# Patient Record
Sex: Female | Born: 1975 | Race: White | Hispanic: No | Smoking: Current every day smoker
Health system: Southern US, Community
[De-identification: ages and names within clinical notes are randomized; demographics above are authoritative.]

## PROBLEM LIST (undated history)

## (undated) DIAGNOSIS — R519 Headache, unspecified: Secondary | ICD-10-CM

## (undated) DIAGNOSIS — R51 Headache: Secondary | ICD-10-CM

## (undated) DIAGNOSIS — K5790 Diverticulosis of intestine, part unspecified, without perforation or abscess without bleeding: Secondary | ICD-10-CM

## (undated) DIAGNOSIS — E7800 Pure hypercholesterolemia, unspecified: Secondary | ICD-10-CM

## (undated) DIAGNOSIS — J45909 Unspecified asthma, uncomplicated: Secondary | ICD-10-CM

## (undated) DIAGNOSIS — F988 Other specified behavioral and emotional disorders with onset usually occurring in childhood and adolescence: Secondary | ICD-10-CM

## (undated) DIAGNOSIS — K589 Irritable bowel syndrome without diarrhea: Secondary | ICD-10-CM

## (undated) DIAGNOSIS — T4145XA Adverse effect of unspecified anesthetic, initial encounter: Secondary | ICD-10-CM

## (undated) DIAGNOSIS — S83511A Sprain of anterior cruciate ligament of right knee, initial encounter: Secondary | ICD-10-CM

## (undated) DIAGNOSIS — K219 Gastro-esophageal reflux disease without esophagitis: Secondary | ICD-10-CM

## (undated) DIAGNOSIS — T8859XA Other complications of anesthesia, initial encounter: Secondary | ICD-10-CM

## (undated) DIAGNOSIS — E162 Hypoglycemia, unspecified: Secondary | ICD-10-CM

## (undated) DIAGNOSIS — E039 Hypothyroidism, unspecified: Secondary | ICD-10-CM

## (undated) DIAGNOSIS — M5136 Other intervertebral disc degeneration, lumbar region: Secondary | ICD-10-CM

## (undated) DIAGNOSIS — M51369 Other intervertebral disc degeneration, lumbar region without mention of lumbar back pain or lower extremity pain: Secondary | ICD-10-CM

## (undated) DIAGNOSIS — M7750 Other enthesopathy of unspecified foot: Secondary | ICD-10-CM

## (undated) DIAGNOSIS — Z969 Presence of functional implant, unspecified: Secondary | ICD-10-CM

## (undated) HISTORY — PX: APPENDECTOMY: SHX54

## (undated) HISTORY — PX: TUBAL LIGATION: SHX77

## (undated) HISTORY — PX: LEEP: SHX91

---

## 2001-08-15 ENCOUNTER — Emergency Department (HOSPITAL_COMMUNITY): Admission: EM | Admit: 2001-08-15 | Discharge: 2001-08-15 | Payer: Self-pay | Admitting: Emergency Medicine

## 2001-10-25 ENCOUNTER — Ambulatory Visit (HOSPITAL_COMMUNITY): Admission: RE | Admit: 2001-10-25 | Discharge: 2001-10-25 | Payer: Self-pay | Admitting: Orthopedic Surgery

## 2001-10-25 ENCOUNTER — Encounter: Payer: Self-pay | Admitting: Orthopedic Surgery

## 2001-12-14 ENCOUNTER — Emergency Department (HOSPITAL_COMMUNITY): Admission: EM | Admit: 2001-12-14 | Discharge: 2001-12-14 | Payer: Self-pay | Admitting: Emergency Medicine

## 2002-05-30 ENCOUNTER — Emergency Department (HOSPITAL_COMMUNITY): Admission: EM | Admit: 2002-05-30 | Discharge: 2002-05-30 | Payer: Self-pay | Admitting: Emergency Medicine

## 2003-07-10 ENCOUNTER — Emergency Department (HOSPITAL_COMMUNITY): Admission: AD | Admit: 2003-07-10 | Discharge: 2003-07-10 | Payer: Self-pay | Admitting: Family Medicine

## 2003-07-18 ENCOUNTER — Emergency Department (HOSPITAL_COMMUNITY): Admission: EM | Admit: 2003-07-18 | Discharge: 2003-07-18 | Payer: Self-pay | Admitting: Emergency Medicine

## 2003-10-29 ENCOUNTER — Emergency Department (HOSPITAL_COMMUNITY): Admission: EM | Admit: 2003-10-29 | Discharge: 2003-10-29 | Payer: Self-pay | Admitting: *Deleted

## 2003-11-19 ENCOUNTER — Emergency Department (HOSPITAL_COMMUNITY): Admission: EM | Admit: 2003-11-19 | Discharge: 2003-11-19 | Payer: Self-pay | Admitting: Emergency Medicine

## 2003-12-11 ENCOUNTER — Emergency Department (HOSPITAL_COMMUNITY): Admission: EM | Admit: 2003-12-11 | Discharge: 2003-12-11 | Payer: Self-pay | Admitting: *Deleted

## 2004-01-25 ENCOUNTER — Emergency Department (HOSPITAL_COMMUNITY): Admission: EM | Admit: 2004-01-25 | Discharge: 2004-01-25 | Payer: Self-pay | Admitting: Emergency Medicine

## 2004-01-26 ENCOUNTER — Other Ambulatory Visit: Admission: RE | Admit: 2004-01-26 | Discharge: 2004-01-26 | Payer: Self-pay | Admitting: Family Medicine

## 2004-01-26 ENCOUNTER — Ambulatory Visit: Payer: Self-pay | Admitting: Sports Medicine

## 2004-06-28 ENCOUNTER — Emergency Department (HOSPITAL_COMMUNITY): Admission: EM | Admit: 2004-06-28 | Discharge: 2004-06-28 | Payer: Self-pay | Admitting: Emergency Medicine

## 2004-07-01 ENCOUNTER — Ambulatory Visit: Payer: Self-pay | Admitting: Family Medicine

## 2004-07-29 ENCOUNTER — Ambulatory Visit: Payer: Self-pay | Admitting: Family Medicine

## 2004-08-25 ENCOUNTER — Emergency Department (HOSPITAL_COMMUNITY): Admission: EM | Admit: 2004-08-25 | Discharge: 2004-08-25 | Payer: Self-pay | Admitting: *Deleted

## 2004-09-16 ENCOUNTER — Emergency Department (HOSPITAL_COMMUNITY): Admission: EM | Admit: 2004-09-16 | Discharge: 2004-09-16 | Payer: Self-pay | Admitting: Emergency Medicine

## 2004-10-06 ENCOUNTER — Emergency Department (HOSPITAL_COMMUNITY): Admission: EM | Admit: 2004-10-06 | Discharge: 2004-10-06 | Payer: Self-pay | Admitting: Emergency Medicine

## 2004-10-20 ENCOUNTER — Emergency Department (HOSPITAL_COMMUNITY): Admission: EM | Admit: 2004-10-20 | Discharge: 2004-10-20 | Payer: Self-pay | Admitting: *Deleted

## 2004-10-23 ENCOUNTER — Emergency Department (HOSPITAL_COMMUNITY): Admission: EM | Admit: 2004-10-23 | Discharge: 2004-10-23 | Payer: Self-pay | Admitting: Emergency Medicine

## 2004-10-24 ENCOUNTER — Emergency Department (HOSPITAL_COMMUNITY): Admission: EM | Admit: 2004-10-24 | Discharge: 2004-10-24 | Payer: Self-pay | Admitting: Emergency Medicine

## 2004-12-28 ENCOUNTER — Emergency Department (HOSPITAL_COMMUNITY): Admission: EM | Admit: 2004-12-28 | Discharge: 2004-12-28 | Payer: Self-pay | Admitting: Emergency Medicine

## 2005-01-05 ENCOUNTER — Encounter (INDEPENDENT_AMBULATORY_CARE_PROVIDER_SITE_OTHER): Payer: Self-pay | Admitting: *Deleted

## 2005-01-05 LAB — CONVERTED CEMR LAB

## 2005-01-06 ENCOUNTER — Emergency Department (HOSPITAL_COMMUNITY): Admission: EM | Admit: 2005-01-06 | Discharge: 2005-01-06 | Payer: Self-pay | Admitting: Emergency Medicine

## 2005-01-29 ENCOUNTER — Ambulatory Visit: Payer: Self-pay | Admitting: Family Medicine

## 2005-02-25 ENCOUNTER — Encounter: Admission: RE | Admit: 2005-02-25 | Discharge: 2005-02-25 | Payer: Self-pay | Admitting: Family Medicine

## 2005-04-25 ENCOUNTER — Emergency Department (HOSPITAL_COMMUNITY): Admission: EM | Admit: 2005-04-25 | Discharge: 2005-04-25 | Payer: Self-pay | Admitting: Emergency Medicine

## 2006-06-04 DIAGNOSIS — E669 Obesity, unspecified: Secondary | ICD-10-CM

## 2006-06-04 DIAGNOSIS — F411 Generalized anxiety disorder: Secondary | ICD-10-CM | POA: Insufficient documentation

## 2006-06-04 DIAGNOSIS — K429 Umbilical hernia without obstruction or gangrene: Secondary | ICD-10-CM | POA: Insufficient documentation

## 2006-06-04 DIAGNOSIS — N83209 Unspecified ovarian cyst, unspecified side: Secondary | ICD-10-CM

## 2006-06-04 DIAGNOSIS — F17200 Nicotine dependence, unspecified, uncomplicated: Secondary | ICD-10-CM

## 2006-06-04 DIAGNOSIS — J45909 Unspecified asthma, uncomplicated: Secondary | ICD-10-CM | POA: Insufficient documentation

## 2006-06-05 ENCOUNTER — Encounter (INDEPENDENT_AMBULATORY_CARE_PROVIDER_SITE_OTHER): Payer: Self-pay | Admitting: *Deleted

## 2006-06-09 ENCOUNTER — Emergency Department (HOSPITAL_COMMUNITY): Admission: EM | Admit: 2006-06-09 | Discharge: 2006-06-09 | Payer: Self-pay | Admitting: Family Medicine

## 2006-08-03 ENCOUNTER — Emergency Department (HOSPITAL_COMMUNITY): Admission: EM | Admit: 2006-08-03 | Discharge: 2006-08-03 | Payer: Self-pay | Admitting: Family Medicine

## 2006-08-04 ENCOUNTER — Ambulatory Visit: Payer: Self-pay | Admitting: Family Medicine

## 2006-08-04 ENCOUNTER — Encounter (INDEPENDENT_AMBULATORY_CARE_PROVIDER_SITE_OTHER): Payer: Self-pay | Admitting: Family Medicine

## 2006-08-12 ENCOUNTER — Encounter (INDEPENDENT_AMBULATORY_CARE_PROVIDER_SITE_OTHER): Payer: Self-pay | Admitting: Family Medicine

## 2006-08-20 ENCOUNTER — Telehealth: Payer: Self-pay | Admitting: *Deleted

## 2006-08-24 ENCOUNTER — Encounter: Payer: Self-pay | Admitting: *Deleted

## 2006-09-01 ENCOUNTER — Encounter: Payer: Self-pay | Admitting: *Deleted

## 2006-12-08 ENCOUNTER — Telehealth (INDEPENDENT_AMBULATORY_CARE_PROVIDER_SITE_OTHER): Payer: Self-pay | Admitting: Family Medicine

## 2007-01-05 ENCOUNTER — Ambulatory Visit: Payer: Self-pay | Admitting: Family Medicine

## 2007-01-05 ENCOUNTER — Encounter (INDEPENDENT_AMBULATORY_CARE_PROVIDER_SITE_OTHER): Payer: Self-pay | Admitting: Family Medicine

## 2007-01-05 DIAGNOSIS — E039 Hypothyroidism, unspecified: Secondary | ICD-10-CM | POA: Insufficient documentation

## 2007-01-05 DIAGNOSIS — E782 Mixed hyperlipidemia: Secondary | ICD-10-CM

## 2007-01-05 LAB — CONVERTED CEMR LAB
Albumin: 4.5 g/dL (ref 3.5–5.2)
Alkaline Phosphatase: 51 units/L (ref 39–117)
BUN: 11 mg/dL (ref 6–23)
Calcium: 9.1 mg/dL (ref 8.4–10.5)
Chloride: 109 meq/L (ref 96–112)
Direct LDL: 128 mg/dL — ABNORMAL HIGH
Glucose, Bld: 91 mg/dL (ref 70–99)
Hemoglobin: 15.8 g/dL — ABNORMAL HIGH (ref 12.0–15.0)
MCHC: 34.2 g/dL (ref 30.0–36.0)
Potassium: 4.7 meq/L (ref 3.5–5.3)
RBC: 5.34 M/uL — ABNORMAL HIGH (ref 3.87–5.11)
Sodium: 140 meq/L (ref 135–145)
Total Protein: 7 g/dL (ref 6.0–8.3)
WBC: 8.6 10*3/uL (ref 4.0–10.5)

## 2007-01-06 ENCOUNTER — Encounter (INDEPENDENT_AMBULATORY_CARE_PROVIDER_SITE_OTHER): Payer: Self-pay | Admitting: Family Medicine

## 2007-01-12 ENCOUNTER — Telehealth (INDEPENDENT_AMBULATORY_CARE_PROVIDER_SITE_OTHER): Payer: Self-pay | Admitting: Family Medicine

## 2007-01-15 ENCOUNTER — Telehealth (INDEPENDENT_AMBULATORY_CARE_PROVIDER_SITE_OTHER): Payer: Self-pay | Admitting: *Deleted

## 2007-01-26 ENCOUNTER — Encounter (INDEPENDENT_AMBULATORY_CARE_PROVIDER_SITE_OTHER): Payer: Self-pay | Admitting: Family Medicine

## 2007-02-08 ENCOUNTER — Encounter: Payer: Self-pay | Admitting: *Deleted

## 2007-03-19 ENCOUNTER — Other Ambulatory Visit: Admission: RE | Admit: 2007-03-19 | Discharge: 2007-03-19 | Payer: Self-pay | Admitting: Family Medicine

## 2007-03-19 ENCOUNTER — Ambulatory Visit: Payer: Self-pay | Admitting: Family Medicine

## 2007-03-19 ENCOUNTER — Encounter (INDEPENDENT_AMBULATORY_CARE_PROVIDER_SITE_OTHER): Payer: Self-pay | Admitting: Family Medicine

## 2007-03-19 LAB — CONVERTED CEMR LAB

## 2007-03-25 LAB — CONVERTED CEMR LAB: Pap Smear: NORMAL

## 2007-04-16 ENCOUNTER — Telehealth (INDEPENDENT_AMBULATORY_CARE_PROVIDER_SITE_OTHER): Payer: Self-pay | Admitting: Family Medicine

## 2007-04-19 ENCOUNTER — Telehealth (INDEPENDENT_AMBULATORY_CARE_PROVIDER_SITE_OTHER): Payer: Self-pay | Admitting: Family Medicine

## 2007-05-14 ENCOUNTER — Ambulatory Visit: Payer: Self-pay | Admitting: Family Medicine

## 2007-06-16 ENCOUNTER — Encounter (INDEPENDENT_AMBULATORY_CARE_PROVIDER_SITE_OTHER): Payer: Self-pay | Admitting: Family Medicine

## 2007-06-16 ENCOUNTER — Ambulatory Visit: Payer: Self-pay | Admitting: Family Medicine

## 2007-07-01 ENCOUNTER — Telehealth (INDEPENDENT_AMBULATORY_CARE_PROVIDER_SITE_OTHER): Payer: Self-pay | Admitting: Family Medicine

## 2007-07-07 ENCOUNTER — Encounter: Payer: Self-pay | Admitting: *Deleted

## 2007-07-12 ENCOUNTER — Telehealth (INDEPENDENT_AMBULATORY_CARE_PROVIDER_SITE_OTHER): Payer: Self-pay | Admitting: Family Medicine

## 2007-07-20 ENCOUNTER — Ambulatory Visit: Payer: Self-pay | Admitting: Family Medicine

## 2007-09-21 ENCOUNTER — Encounter (INDEPENDENT_AMBULATORY_CARE_PROVIDER_SITE_OTHER): Payer: Self-pay | Admitting: Family Medicine

## 2007-09-22 ENCOUNTER — Encounter: Payer: Self-pay | Admitting: Family Medicine

## 2007-10-19 ENCOUNTER — Encounter: Payer: Self-pay | Admitting: Family Medicine

## 2007-12-06 ENCOUNTER — Telehealth: Payer: Self-pay | Admitting: *Deleted

## 2007-12-08 ENCOUNTER — Ambulatory Visit: Payer: Self-pay | Admitting: Family Medicine

## 2007-12-08 DIAGNOSIS — J309 Allergic rhinitis, unspecified: Secondary | ICD-10-CM | POA: Insufficient documentation

## 2007-12-08 DIAGNOSIS — G894 Chronic pain syndrome: Secondary | ICD-10-CM | POA: Insufficient documentation

## 2007-12-18 ENCOUNTER — Emergency Department (HOSPITAL_COMMUNITY): Admission: EM | Admit: 2007-12-18 | Discharge: 2007-12-19 | Payer: Self-pay | Admitting: Emergency Medicine

## 2007-12-21 ENCOUNTER — Encounter (INDEPENDENT_AMBULATORY_CARE_PROVIDER_SITE_OTHER): Payer: Self-pay | Admitting: *Deleted

## 2007-12-28 ENCOUNTER — Encounter (INDEPENDENT_AMBULATORY_CARE_PROVIDER_SITE_OTHER): Payer: Self-pay | Admitting: *Deleted

## 2007-12-28 ENCOUNTER — Encounter: Payer: Self-pay | Admitting: *Deleted

## 2007-12-29 ENCOUNTER — Encounter: Payer: Self-pay | Admitting: Family Medicine

## 2008-01-01 ENCOUNTER — Emergency Department (HOSPITAL_COMMUNITY): Admission: EM | Admit: 2008-01-01 | Discharge: 2008-01-02 | Payer: Self-pay | Admitting: Emergency Medicine

## 2008-01-03 ENCOUNTER — Telehealth: Payer: Self-pay | Admitting: *Deleted

## 2008-01-04 ENCOUNTER — Emergency Department (HOSPITAL_COMMUNITY): Admission: EM | Admit: 2008-01-04 | Discharge: 2008-01-05 | Payer: Self-pay | Admitting: Family Medicine

## 2008-01-05 ENCOUNTER — Ambulatory Visit: Payer: Self-pay | Admitting: Gynecology

## 2008-01-06 ENCOUNTER — Encounter: Payer: Self-pay | Admitting: *Deleted

## 2008-01-06 ENCOUNTER — Ambulatory Visit: Payer: Self-pay | Admitting: Family Medicine

## 2008-01-07 ENCOUNTER — Ambulatory Visit: Payer: Self-pay | Admitting: Gynecology

## 2008-01-07 ENCOUNTER — Encounter: Payer: Self-pay | Admitting: Gynecology

## 2008-01-07 ENCOUNTER — Ambulatory Visit (HOSPITAL_BASED_OUTPATIENT_CLINIC_OR_DEPARTMENT_OTHER): Admission: RE | Admit: 2008-01-07 | Discharge: 2008-01-07 | Payer: Self-pay | Admitting: Gynecology

## 2008-01-07 HISTORY — PX: LAPAROSCOPIC OVARIAN CYSTECTOMY: SHX6248

## 2008-01-07 HISTORY — PX: LAPAROSCOPIC UNILATERAL SALPINGECTOMY: SHX5934

## 2008-01-11 ENCOUNTER — Encounter: Payer: Self-pay | Admitting: Family Medicine

## 2008-01-11 ENCOUNTER — Encounter (INDEPENDENT_AMBULATORY_CARE_PROVIDER_SITE_OTHER): Payer: Self-pay | Admitting: *Deleted

## 2008-01-19 ENCOUNTER — Ambulatory Visit: Payer: Self-pay | Admitting: Gynecology

## 2008-01-24 ENCOUNTER — Emergency Department (HOSPITAL_COMMUNITY): Admission: EM | Admit: 2008-01-24 | Discharge: 2008-01-24 | Payer: Self-pay | Admitting: Emergency Medicine

## 2008-01-24 ENCOUNTER — Telehealth: Payer: Self-pay | Admitting: Family Medicine

## 2008-02-08 ENCOUNTER — Encounter (INDEPENDENT_AMBULATORY_CARE_PROVIDER_SITE_OTHER): Payer: Self-pay | Admitting: *Deleted

## 2008-02-17 ENCOUNTER — Telehealth: Payer: Self-pay | Admitting: Family Medicine

## 2008-02-29 ENCOUNTER — Ambulatory Visit: Payer: Self-pay | Admitting: Family Medicine

## 2008-02-29 ENCOUNTER — Encounter: Payer: Self-pay | Admitting: Family Medicine

## 2008-02-29 DIAGNOSIS — M2240 Chondromalacia patellae, unspecified knee: Secondary | ICD-10-CM

## 2008-02-29 LAB — CONVERTED CEMR LAB: TSH: 5.844 microintl units/mL — ABNORMAL HIGH (ref 0.350–4.50)

## 2008-03-06 ENCOUNTER — Telehealth: Payer: Self-pay | Admitting: *Deleted

## 2008-04-03 ENCOUNTER — Emergency Department (HOSPITAL_COMMUNITY): Admission: EM | Admit: 2008-04-03 | Discharge: 2008-04-03 | Payer: Self-pay | Admitting: Family Medicine

## 2008-04-24 ENCOUNTER — Telehealth: Payer: Self-pay | Admitting: *Deleted

## 2008-04-25 ENCOUNTER — Ambulatory Visit: Payer: Self-pay | Admitting: Family Medicine

## 2008-04-25 ENCOUNTER — Emergency Department (HOSPITAL_COMMUNITY): Admission: EM | Admit: 2008-04-25 | Discharge: 2008-04-25 | Payer: Self-pay | Admitting: Emergency Medicine

## 2008-05-17 ENCOUNTER — Telehealth: Payer: Self-pay | Admitting: Family Medicine

## 2008-05-18 ENCOUNTER — Encounter: Payer: Self-pay | Admitting: Family Medicine

## 2010-01-20 ENCOUNTER — Encounter: Payer: Self-pay | Admitting: Family Medicine

## 2010-03-15 ENCOUNTER — Telehealth: Payer: Self-pay | Admitting: Family Medicine

## 2010-05-07 NOTE — Letter (Signed)
Summary: [place] [facility]:  Psychological Assessment  [facility]:  Psychological Assessment   Imported By: Haydee Salter 06/02/2008 14:59:30  _____________________________________________________________________  External Attachment:    Type:   Image     Comment:   External Document

## 2010-05-07 NOTE — Progress Notes (Signed)
Summary: Refill  Phone Note Refill Request Call back at Home Phone [phone] Message from:  Patient  Refills Requested: Medication #1:  VICODIN 5-500 MG  TABS 1 tab by mouth two times a day as needed pain. wants to discuss with rn about being in a lot of pain  Initial call taken by: Haydee Salter,  January 24, 2008 9:40 AM  Follow-up for Phone Call        Phone call to pt- number busy x2. Follow-up by: AMY MARTIN RN,  January 24, 2008 11:16 AM  Additional Follow-up for Phone Call Additional follow up Details #1::        Patient must make appt if requesting narcotic. Please see last visit note for details. Thanks.  Additional Follow-up by: Helane Rima MD,  January 24, 2008 12:09 PM    Additional Follow-up for Phone Call Additional follow up Details #2::    Number busy. Follow-up by: AMY MARTIN RN,  January 24, 2008 12:30 PM  Additional Follow-up for Phone Call Additional follow up Details #3:: Details for Additional Follow-up Action Taken: returning call Additional Follow-up by: Haydee Salter,  January 24, 2008 2:10 PM  pt calling again.....WHITNEY KIVETT 01/24/08 2:42PM  attempted to call patient .no answer. Theresia Lo RN  January 24, 2008 5:18 PM  attempted to call again. no answer. Theresia Lo RN  January 24, 2008 6:12 PM  called pt no answer.Jacki Cones RN  January 25, 2008 12:32 PM  Appended Document: Refill is calling again, states she can be reached at the above number  Appended Document: Refill called again no answer.  Will await callback once again.  NO REFILLS without appt.

## 2010-05-07 NOTE — Miscellaneous (Signed)
Summary: Records from [facility]  Clinical Lists Changes I have reviewed Rachel Coleman's records from the [facility]. She was incarcerated 09/15/07-11/04/07. She was NOT given a narcotic or benzo during that time. I will have pertinent pages scanned for documentation. Please note that she and her husband have a history of polysubstance abuse.

## 2010-05-07 NOTE — Miscellaneous (Signed)
Summary: Asthma Update - Intermittent     New Problems: ASTHMA, INTERMITTENT (ICD-493.90)   New Problems: ASTHMA, INTERMITTENT (ICD-493.90) 

## 2010-05-07 NOTE — Miscellaneous (Signed)
Summary: N[facility]IW  Records from previous office are in your box.  Please mark the pages you would like scanned, then return to front office Admin Team.

## 2010-05-07 NOTE — Progress Notes (Signed)
Summary: Refill  Phone Note Call from Patient Call back at [phone]    Reason for Call: Refill Medication Summary of Call: pt is requesting a refill on her meds, tried scheduling an appt but MD is booked until Dec.  pt will call next wk for an appt Initial call taken by: Knox Royalty,  February 17, 2008 9:36 AM  Follow-up for Phone Call        called pt. pt needs refill of klonopin and vicodin. i told the pt that i need to ask dr.Psalms Olarte for refills. dnka x 2. pt said, that 01-11-08 she had surgery and 02-08-08 her husband had appt, had surgery also. she said, that she would sched. ov with dr.Victorious Kundinger asap. fwd. to dr.Kiel Cockerell for review. pt said, that she wants written rx's. also said, that she is applying for disability at this point and that dr.Taesha Goodell will receive a form in the mail. Follow-up by: Arlyss Repress CMA,,  February 17, 2008 9:49 AM  Additional Follow-up for Phone Call Additional follow up Details #1::        Patient MUST be seen by a physician for refills of both medications, especially the Vicodin. Please see my last office note for details. Unless her exam/ history has changed, I will not prescribe narcotics. Also, please inform her that I do not fill out disablity paperwork (one is usually provided for that). Additional Follow-up by: Helane Rima MD,  February 17, 2008 12:49 PM      Appended Document: Refill called pt. no answer.  Appended Document: Refill pt would like to know if there is an openeing before december. She was told there were noopenings before this. Do you want to double book an appt/? or have her wait?  Appended Document: Refill You may double book. Thanks! Luticia Tadros  Appended Document: Refill wants to know if we can doublebook MD for rx refills as she states she had to go to court last week and had an anxiety attack there.

## 2010-05-07 NOTE — Progress Notes (Signed)
Summary: refill request  Phone Note From Pharmacy   Summary of Call: received call from [facility] . states patient brought in bottle for refill for Levothyroxin 100 mcg.. she had previously gotten from [facility]. requests refill request be sent to [facility]. this dose is not noted in chart. will send message to MD to please advise. Initial call taken by: Theresia Lo RN,  May 17, 2008 10:04 AM    I recently increased her dose to 112 micrograms daily. Dose in chart is correct. Please inform patient that Rx sent to [facility].  Prescriptions: SYNTHROID 112 MCG TABS (LEVOTHYROXINE SODIUM) one by mouth daily  #30 x 3   Entered and Authorized by:   Helane Rima MD   Signed by:   Helane Rima MD on 05/17/2008   Method used:   Electronically to        [facility] (retail)       [address]       [place], [place]  [id number]       Ph: [phone]       Fax: [phone]   RxID:   [id number]   Appended Document: refill request unable to contact patient by phone. called pharmacist and he will point out to patient that this is a change in dose when she picks up.

## 2010-05-07 NOTE — Assessment & Plan Note (Signed)
Summary: meds wp per dr Raymone Pembroke   Vital Signs:  Patient Profile:   35 Years Old Female Height:     65 inches (165.10 cm) Weight:      205.0 pounds Temp:     98.3 degrees F oral Pulse rate:   80 / minute BP sitting:   101 / 60  (left arm)  Vitals Entered By: Alphia Kava (February 29, 2008 2:12 PM)                 PCP:  Helane Rima DO  Chief Complaint:  f/u meds.  History of Present Illness: Rachel Coleman is a 35 year old female presenting with requests for Vicodin and Klonopin refills. Please see previous note for my concerns regarding these medications in treating this patient's symptoms.  1. VICODIN: The patient is requesting Vicodin for her chronic knee and ankle pain. A review of E-Chart reveals a recent ED visit for right foot pain. The ED physician noted that Rachel Coleman c/o right foot pain, previously diagnosed as a bone spur by Dr. Chaney Malling, that will eventually require surgery to alleviate pain. She stated that she has an appointment with him in one week, but needed pain medication until that visit. She was prescribed Percocet 5/325 #10. I contacted Dr. Andreas Blower office and was informed that she has not been a patient since 2008.   Her records were faxed to our office and reviewed. 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 Dr. Chaney Malling did consider surgery a treatment for this pain, writing "No guarantee can be given as to outcome, but clearly this is abnormal anatomy and probably causing her discomfort." He did not prescribe or recommend narcotics.  2. KLONOPIN: I have reviewed the patient's chart and noted Dr. Frutoso Chase intention to wean the patient off of this medication. I agree with  this plan. She was incarcerated from 09/15/07 to 10/25/07, during which time she was not on Klonopin.   Note: The patient also states that she has been told that she may have ADHD by another physician and was told to go to the [facility], but that they are too expensive.  3. HYPOTHYROIDISM: Due for TSH check. Has been on Synthroid 100 micrograms daily.  4. 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 [facility] to [facility]copy Clinic 01/11/08.    Current Allergies: ! 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    Review of Systems       The patient complains of difficulty walking.  The patient denies fever, chest pain, syncope, dyspnea on exertion, peripheral edema, prolonged cough, and abdominal pain.    MS  Complains of joint pain.  Psych      Complains of anxiety and panic attacks.   Physical Exam  General:     Alert, well-developed, well-nourished, well-hydrated, and overweight-appearing.   Neck:     No deformities, masses, or tenderness noted. Lungs:     Normal respiratory effort, chest expands symmetrically. Lungs are clear to auscultation, no crackles or wheezes. Heart:     Normal rate and regular rhythm. S1 and S2 normal without gallop, murmur, click, rub or other extra sounds. Msk:     No deformity or scoliosis noted of thoracic or lumbar spine.   Extremities:     No clubbing, cyanosis, edema, or deformity noted with normal full range of motion of all joints.   Skin:     Intact without suspicious lesions or rashes. Psych:      Alert and oriented, pressured speech.   Foot/Ankle Exam  Gait:    Antalgic.    Skin:    Intact with no scars, lesions, rashes, cafe-au-lait spots or bruising.    Inspection:    Inspection reveals no ecchymosis or swelling.   Palpation:    Palpable bony protuberance right anterior talus.  Vascular:    Dorsalis pedis and posterior tibial pulses 2+ and symmetric, capillary refill < 2 seconds, normal hair pattern, no evidence of ischemia.   Motor:    Motor strength 5/5 bilaterally for ankle dorsiflexion, ankle plantar flexion, ankle inversion and ankle eversion.  She did complain of pain in anterior ankle with dorsiflexion.    Impression & Recommendations:  Problem # 1:  ANXIETY (ICD-300.00) Assessment: Unchanged I will refill the Klonopin today. Discussed my plan to wean this medication and to work on other ways to cope with anxiety. The patient voiced understanding.   Her updated medication list for this problem includes:    Klonopin 0.5 Mg Tabs (Clonazepam) .Marland Kitchen... 1 tab by mouth two times a day.  Orders: [facility]- Est  Level 4 ([address])   Problem # 2:  PAIN IN JOINT, ANKLE AND FOOT (ICD-719.47) Assessment: Deteriorated Possible Ankle Impingement Syndrome  Informed patient that I will not prescribe narcotics for this problem. Advised physical therapy, ice, heat, Ibuprofen. If pain is uncontrollable, the patient may be referred to Dr. Chaney Malling for surgery. The patient states that she cannot afford to see Ortho at this time as they request a fee of $250. Will consider sending to Sports Medicine Clinic; Joint injection. Increase Neurontin to 300 mg in the am and 600 mg in the pm for pain.  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 The following medications were removed from the medication list:    Vicodin 5-500 Mg Tabs (Hydrocodone-acetaminophen) .Marland Kitchen... 1 tab by mouth two  times a day as needed pain.  Her updated medication list for this problem includes:    Ibuprofen 600 Mg Tabs (Ibuprofen) .Marland Kitchen... Take one tablet up to three times a day daily as needed pain. Take with food  Orders: Physical Therapy Referral (PT) [facility]- Est  Level 4 (09811)  The following medications were removed from the medication list:    Vicodin 5-500 Mg Tabs (Hydrocodone-acetaminophen) .Marland Kitchen... 1 tab by mouth two times a day as needed pain.  Her updated medication list for this problem includes:    Ibuprofen 600 Mg Tabs (Ibuprofen) .Marland Kitchen... Take one tablet up to three times a day daily as needed pain. take with food  Problem # 4:  HYPOTHYROIDISM NOS (ICD-244.9) Assessment: Deteriorated TSH elevated today; Will increase Synthroid to 112 micrograms daily.  Her updated medication list for this problem includes:    Synthroid 112 Mcg Tabs (Levothyroxine sodium) ..... 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 Make appointment for Colposcopy.  Problem # 6:  DRUG SEEKING BEHAVIOR The patient and her partner have history of polysubstance abuse. I have reviewed her records: electronic medical records, records from [facility], Dr. Chaney Malling, [facility], ED visits. The patient has requested narcotics for multiple pain complaints. She has been seen in the ED multiple times for pain, requesting narcotics. I have informed her that I will not prescribe narcotics. She voiced understanding.  I have also informed her of my plan to wean her from the Klonopin.   I will consider a UDS at her next visit.   Complete Medication List: 1)  Klonopin 0.5 Mg Tabs (Clonazepam) .Marland Kitchen.. 1 tab by mouth two times a day. 2)  Synthroid 112 Mcg Tabs (Levothyroxine sodium) .... One by mouth daily 3)  Zyrtec Allergy 10 Mg Tabs (Cetirizine hcl) .Marland Kitchen.. 1 tab by mouth  daily 4)  Proventil 90 Mcg/act Aers (Albuterol) .Marland Kitchen.. 1 puff q 4 as needed wheeze use with spacer 5)  Breatherite Coll Spacer Adult Misc (Spacer/aero-holding chambers) .... Use spacer with inhaler at all times.  this helps to get the medicine in your lungs 6)  Zantac 150 Mg Caps (Ranitidine hcl) .... One tablet twice daily for reflux 7)  Mevacor 20 Mg Tabs (Lovastatin) .Marland Kitchen.. 1 tab by mouth daily for chol 8)  Lomotil 2.5-0.025 Mg Tabs (Diphenoxylate-atropine) .Marland Kitchen.. 1 tab by mouth q6h as needed 9)  Neurontin 300 Mg Caps (Gabapentin) .Marland Kitchen.. 1 cap by mouth two times a day 10)  Bentyl 20 Mg Tabs (Dicyclomine hcl) .Marland Kitchen.. 1 tab by mouth q6h as needed 11)  Ibuprofen 600 Mg Tabs (Ibuprofen) .... Take one tablet up to three times a day daily as needed pain. take with food   Patient Instructions: 1)  Please schedule a follow-up appointment in 1 month. Please follow up in the colposcopy clinic as soon as possible. 2)  Please take Ibuprofen for foot and ankle pain. Take with food and no more than every 8 hours. 3)  I will refill your Klonopin today. Please go to the [facility] for a free 15 minute evaluation. 4)  Increase your neurontin to 300mg  at night and 600mg  in the evening. 5)  I will call if your thyroid medication needs to be adjusted.   Prescriptions: SYNTHROID 112 MCG TABS (LEVOTHYROXINE SODIUM) one by mouth daily  #30 x 3   Entered and Authorized by:   Helane Rima MD   Signed by:   Helane Rima MD on 03/01/2008   Method used:   Electronically to        [facility] # [phone]* (retail)       [address] [place], [place]  [address]       Ph: [phone]       Fax: [phone]   RxID:   [id number] BENTYL 20 MG TABS (DICYCLOMINE HCL) 1 tab by mouth q6h as needed  #120 x 3   Entered and Authorized by:   Helane Rima MD  Signed by:   Helane Rima MD on 02/29/2008   Method used:   Electronically to        [facility] # [phone]* (retail)        [address] [place], [place]  [address]       Ph: [phone]       Fax: [phone]   RxID:   [id number] IBUPROFEN 600 MG TABS (IBUPROFEN) take one tablet up to three times a day daily as needed pain. take with food  #90 x 3   Entered and Authorized by:   Helane Rima MD   Signed by:   Helane Rima MD on 02/29/2008   Method used:   Electronically to        [facility] # [phone]* (retail)       [address] [place], [place]  [address]       Ph: [phone]       Fax: [phone]   RxID:   [id number] NEURONTIN 300 MG  CAPS (GABAPENTIN) 1 cap by mouth two times a day  #60 x 3   Entered and Authorized by:   Helane Rima MD   Signed by:   Helane Rima MD on 02/29/2008   Method used:   Electronically to        [facility] # [phone]* (retail)       [address] [place], [place]  [address]       Ph: [phone]       Fax: [phone]   RxID:   [id number] MEVACOR 20 MG  TABS (LOVASTATIN) 1 tab by mouth daily for chol  #31 x 3   Entered and Authorized by:   Helane Rima MD   Signed by:   Helane Rima MD on 02/29/2008   Method used:   Electronically to        [facility] # [id number]* (retail)       [address] [place], [place]  [address]       Ph: [phone]       Fax: [phone]   RxID:   [id number] KLONOPIN 0.5 MG  TABS (CLONAZEPAM) 1 tab by mouth two times a day.  #60 x 0   Entered and Authorized by:   Helane Rima MD   Signed by:   Helane Rima MD on 02/29/2008   Method used:   Print then Give to Patient   RxID:   [id number]  ]  Appended Document: Orders Update    Clinical Lists Changes  Medications: Removed medication of KLONOPIN 0.5 MG  TABS (CLONAZEPAM) 1 tab by mouth two times a day.

## 2010-05-07 NOTE — Assessment & Plan Note (Signed)
Summary: mass "size of football" in abdomen   Vital Signs:  Patient Profile:   35 Years Old Female Height:     65 inches (165.10 cm) Weight:      202.9 pounds BMI:     33.89 Pulse rate:   73 / minute BP sitting:   148 / 91  (right arm)  Pt. in pain?   yes    Location:   abdomen    Intensity:   10  Vitals Entered By: Arlyss Repress CMA, (January 06, 2008 1:46 PM)                  PCP:  Helane Rima MD  Chief Complaint:  refill vicodin and klonopin. Marland Kitchen  History of Present Illness: Rachel Coleman is a 35 year old female presenting for a refill of Percocet and Klonopin.   Rachel Coleman was seen by Dr. Pearletha Forge on 12/08/07. At that time he noted:  1. Chronic pain - states has pain in knee chronically s/p ACL repair (Dr. Chaney Malling orthopedic).  She has been on vicodin 5/500 two times a day. Also taking neurontin two times a day for sciatica same leg.  She states she has tried tylenol, tramadol, NSAIDs and these do not work.  Has been given 60 vicodin per month by previous MD.  Marchia Meiers with known h/o narcotic and benzo abuse.  Is on narcotic contract.  Filled out ROI today to get records from Dr. Chaney Malling.  He prescribed Vicodin 5/500 x 60 tablets at that visit.  Today, Rachel Coleman is requesting her monthly prescrption of Vicodin and Klonopin. Her previous records have been reviewed and will be scanned. When asked about her pain, Rachel Coleman states that she is experiencing abdominal pain that is caused by an ovarian cyst that she will be having surgically removed tomorrow. The was diagnosed at the ED recently.  Review of E-Chart revealed: ED visit ([facility]) 12/19/07 for abdominal pain = received 20 Vicodin ED visit ([facility]) 01/02/08 for abdominal pain = received 20 Percocet ED visit ([facility]) 01/05/08 for abdominal pain = received 15 Vicodin  She did not mention any other pain at that time.        Prior Medication List:  KLONOPIN 0.5 MG  TABS (CLONAZEPAM) 1 tab by mouth two times a  day. VICODIN 5-500 MG  TABS (HYDROCODONE-ACETAMINOPHEN) 1 tab by mouth two times a day as needed pain. SYNTHROID 100 MCG  TABS (LEVOTHYROXINE SODIUM) 1 tab by mouth daily * ADVIL MIGRAINE 800 MG  CAPS (IBUPROFEN) 1 tab by mouth q 8 as needed pain.  Take with food ZYRTEC ALLERGY 10 MG  TABS (CETIRIZINE HCL) 1 tab by mouth daily PROVENTIL 90 MCG/ACT  AERS (ALBUTEROL) 1 puff q 4 as needed wheeze use with spacer BREATHERITE COLL SPACER ADULT   MISC (SPACER/AERO-HOLDING CHAMBERS) Use spacer with inhaler at all times.  This helps to get the medicine in your lungs ZANTAC 150 MG  CAPS (RANITIDINE HCL) One tablet twice daily for reflux MEVACOR 20 MG  TABS (LOVASTATIN) 1 tab by mouth daily for chol LOMOTIL 2.5-0.025 MG  TABS (DIPHENOXYLATE-ATROPINE) 1 tab by mouth q6h as needed NEURONTIN 300 MG  CAPS (GABAPENTIN) 1 cap by mouth two times a day BENTYL 20 MG TABS (DICYCLOMINE HCL) 1 tab by mouth q6h as needed   Current Allergies: ! ASA  Past Medical History:    Reviewed history from 08/04/2006 and no changes required:       ANXIETY  B ACL tears, Chronic knee pain, Pre-cervical CA  Past Surgical History:    Reviewed history from 06/04/2006 and no changes required:       bilateral Tubal ligation - 01/26/2004, LEEP - 01/06/1996   Family History:    Reviewed history from 06/04/2006 and no changes required:       cervical cancer, aunt: breast CA, Mom: DM, HTN  Social History:    Reviewed history from 08/04/2006 and no changes required:       Smoker       Just moved from [place]; Lives in [place] with 3 kids and fiance (together for 5 years); recently in prison for " traffic violation"    Review of Systems       The patient complains of abdominal pain.  The patient denies weight loss, weight gain, chest pain, syncope, dyspnea on exertion, peripheral edema, severe indigestion/heartburn, and depression.     Physical Exam  General:     alert, well-hydrated, and overweight-appearing.     Lungs:     normal respiratory effort, no dullness, no crackles, and no wheezes.   Heart:     Normal rate and regular rhythm. S1 and S2 normal without gallop, murmur, click, rub or other extra sounds. Abdomen:     soft, normal bowel sounds, no distention, no guarding, and no rebound tenderness.  MILD tenderness to palpation LLQ.  Psych:     Oriented X3, moderately anxious, easily distracted, and hyperactive.      Impression & Recommendations:  Problem # 1:  Drug Seeking Behavior I reviewed Rachel Coleman's records as well as recent ED visits with her. I explained that I will not prescribe any narcotics for her today. She did not resist. I will review her old records carefully to evaluate the future need for Rx narcotics and whether another course of treatment may be beneficial.   Problem # 2:  PAP SMEAR, ABNORMAL (ICD-795.00) Assessment: Comment Only Scheduled for colposcopy Tuesday, Jan 11, 2008. Orders: [facility]- Est Level  3 ([id number])   Problem # 3:  OBESITY (ICD-278.00) Assessment: Unchanged Healthy diet and exercise were encouraged. Orders: [facility]- Est Level  3 ([id number])   Problem # 4:  ANXIETY (ICD-300.00) Assessment: Unchanged Rx Refill today.  Her updated medication list for this problem includes:    Klonopin 0.5 Mg Tabs (Clonazepam) .Marland Kitchen... 1 tab by mouth two times a day.  Orders: [facility]- Est Level  3 ([id number])   Complete Medication List: 1)  Klonopin 0.5 Mg Tabs (Clonazepam) .Marland Kitchen.. 1 tab by mouth two times a day. 2)  Vicodin 5-500 Mg Tabs (Hydrocodone-acetaminophen) .Marland Kitchen.. 1 tab by mouth two times a day as needed pain. 3)  Synthroid 100 Mcg Tabs (Levothyroxine sodium) .Marland Kitchen.. 1 tab by mouth daily 4)  Advil Migraine 800 Mg Caps (ibuprofen)  .Marland Kitchen.. 1 tab by mouth q 8 as needed pain.  take with food 5)  Zyrtec Allergy 10 Mg Tabs (Cetirizine hcl) .Marland Kitchen.. 1 tab by mouth daily 6)  Proventil 90 Mcg/act Aers (Albuterol) .Marland Kitchen.. 1 puff q 4 as needed wheeze use with spacer 7)  Breatherite Coll Spacer  Adult Misc (Spacer/aero-holding chambers) .... Use spacer with inhaler at all times.  this helps to get the medicine in your lungs 8)  Zantac 150 Mg Caps (Ranitidine hcl) .... One tablet twice daily for reflux 9)  Mevacor 20 Mg Tabs (Lovastatin) .Marland Kitchen.. 1 tab by mouth daily for chol 10)  Lomotil 2.5-0.025 Mg Tabs (Diphenoxylate-atropine) .Marland Kitchen.. 1 tab by mouth q6h as needed  11)  Neurontin 300 Mg Caps (Gabapentin) .Marland Kitchen.. 1 cap by mouth two times a day 12)  Bentyl 20 Mg Tabs (Dicyclomine hcl) .Marland Kitchen.. 1 tab by mouth q6h as needed 13)  Ibuprofen 600 Mg Tabs (Ibuprofen) .... Take one tablet up to three times a day daily as needed pain. take with food   Patient Instructions: 1)  please sched. appt for colposcopy   ]  Appended Document: mass "size of football" in abdomen

## 2010-05-07 NOTE — Progress Notes (Signed)
Summary: UPDATE PROBLEM LIST   

## 2010-08-20 NOTE — Op Note (Signed)
NAMECHERISA, Rachel Coleman          ACCOUNT NO.:  [id number]   MEDICAL RECORD NO.:  [id number]          PATIENT TYPE:  AMB   LOCATION:  [facility]                         FACILITY:  [facility]   PHYSICIAN:  Timothy P. Fontaine, M.D.DATE OF BIRTH:  03/17/1976   DATE OF PROCEDURE:  01/07/2008  DATE OF DISCHARGE:                               OPERATIVE REPORT   [facility].   PREOPERATIVE DIAGNOSES:  1. Left ovarian cyst.  2. Pelvic pain.   POSTOPERATIVE DIAGNOSES:  1. Left ovarian cyst.  2. Pelvic pain.  3. Left distal hydrosalpinx.   PROCEDURE:  Laparoscopic left ovarian cystectomy, distal left  salpingectomy.   SURGEON:  Colin Broach, MD.   ASSISTANT:  Edyth Gunnels, MD.   ANESTHETIC:  General.   COMPLICATIONS:  None.   ESTIMATED BLOOD LOSS:  Minimal.   SPECIMEN:  1. Opening cell washing.  2. Left ovarian cyst wall.  3. Distal left hydrosalpinx   FINDINGS:  EUA, external BUS, vagina normal, cervix normal.  Bimanual  uterus normal size, midline and mobile.  Right adnexa without masses.  Left adnexa with palpable 7-8 cm mass.  Laparoscopic anterior cul-de-sac  normal, posterior cul-de-sac normal, uterus normal size, shape and  contour.  Right ovary grossly normal, free and mobile.  Right fallopian  tube grossly normal with evidence of prior partial salpingectomy, tubal  sterilization.  Left fallopian tube with distal hydrosalpinx involving  the distal tube remaining from her prior tubal sterilization, proximal  segment normal, left ovary grossly enlarged approximately 7-8 cm in  size, freely mobile.  Subsequent incision with simple cyst completely  removed from the ovarian stroma, no evidence of pelvic adhesions or  endometriosis.  Upper abdominal exam liver smooth, gallbladder grossly  normal, no abnormalities noted.   PROCEDURE:  The patient was taken to the operating room, underwent  general anesthesia, was placed in the low dorsal lithotomy  position,  received an abdominal, perineal, vaginal preparation with Betadine  solution, bladder emptied with in-and-out Foley catheterization, EUA  performed, Hulka tenaculum placed in the cervix for manipulation.  The  patient was draped in the usual fashion.  A repeat transverse  infraumbilical incision was made, using the 10-mm Optiview direct entry  laparoscope the abdomen was directly entered without difficulty and  subsequently insufflated.  Right and left 5-mm suprapubic ports were  then placed under direct visualization after transillumination for the  vessels without difficulty.  Examination of pelvic organs, upper  abdominal exam was carried out with findings noted above.  Opening cell  washing was obtained and sent to pathology.  The left ovary was elevated  due to the attenuated stroma overlying the cyst and no clear plane  between normal ovary it became evident that entry into the cyst was  needed to affect removal.  The ovarian cyst was then incised using the  Harmonic scalpel.  The internal architecture was smooth and subsequently  through blunt dissection the entire cyst wall was stripped from the  ovarian stroma, removed and sent to pathology.  Small ovarian stromal  bleeders were bipolar cauterized to achieve ultimate hemostasis.  There  was a distal  left hydrosalpinx also, with multiple small bulging dilated  areas, and the distal portion of the fallopian tube was removed with the  hydro, with the assistance of the Harmonic scalpel, without difficulty  and sent to pathology.  The pelvis was copiously irrigated, hemostasis  visualized at all surgical sites.  The suprapubic ports were removed,  the gas slowly allowed to escape.  All sites inspected under low  pressure situation, showing adequate hemostasis.  The infraumbilical  port was then backed out under direct visualization, showing adequate  hemostasis, no evidence of hernia formation.  All skin incisions were   injected using 0.25% Marcaine, a #0 Vicryl interrupted subcutaneous  fascial stitch was placed infraumbilically and all skin ports closed  using Dermabond skin adhesive.  The Hulka tenaculum was removed.  The  patient placed in the supine position, awakened without difficulty,  taken to the recovery room in good condition having tolerated the  procedure well.      Timothy P. Fontaine, M.D.  Electronically Signed     TPF/MEDQ  D:  01/07/2008  T:  01/07/2008  Job:  [id number]

## 2010-08-20 NOTE — H&P (Signed)
NAMELURLENE, Rachel          ACCOUNT NO.:  [id number]   MEDICAL RECORD NO.:  [id number]          PATIENT TYPE:  AMB   LOCATION:  [facility]                         FACILITY:  [facility]   PHYSICIAN:  Timothy P. Fontaine, M.D.DATE OF BIRTH:  10/26/1975   DATE OF ADMISSION:  01/07/2008  DATE OF DISCHARGE:                              HISTORY & PHYSICAL   CHIEF COMPLAINT:  Pain.   HISTORY OF PRESENT ILLNESS:  A 35 year old G6, P4, AB two female status  post tubal sterilization, notes increasing lower abdominopelvic pain  approximately one weeks duration.  The patient initially went to the  emergency room where a 6.4 cm anechoic cyst of the left ovary was  diagnosed.  Negative color-flow.  Was treated with pain medication to  follow up as an outpatient.  Her pain worsened.  She represented to the  emergency room and was found to have a 9.1 cm simple clear cyst in the  left adnexa, and she is admitted at this time for laparoscopy, removal  of cyst, possibly removal of ovary and adnexa due to her worsening pain.  The patient does have a CA-125 at the 11, and again negative color flow  to this area.   PAST MEDICAL HISTORY:  1. Panic disorder.  2. Hypoglycemia.  3. Diverticulitis.  4. Hypothyroidism.  5. Hypercholesterolemia.  6. Sciatic nerve pain.   PAST SURGICAL HISTORY:  1. LEEP biopsy of the cervix.  2. Tubal sterilization.  3. Laparoscopic appendectomy.   CURRENT MEDICATIONS:  numerous, see medicine reconciliation sheet   ALLERGIES:  MORPHINE CAUSES HEADACHES AND ASPIRIN.   REVIEW OF SYSTEMS:  Noncontributory.   FAMILY HISTORY:  Noncontributory.   SOCIAL HISTORY:  Noncontributory.   PHYSICAL EXAMINATION:  VITAL SIGNS:  Afebrile, vital signs stable.  HEENT:  Normal.  LUNGS:  Clear.  CARDIAC:  Regular rate.  No rubs, murmurs or gallops.  ABDOMINAL:  Tenderness in the lower pelvis, left greater than right.  No  acute changes such as rebound, guarding, masses, organomegaly.  PELVIC:  External BUS and vagina normal.  Cervix normal.  Uterus grossly  normal in size, midline mobile.  Adnexa left with mass effect, tender to  palpation, right without mass or tenderness.   ASSESSMENT:  A 35 year old G6, P4, AB two female status post tubal  sterilization, worsening abdominopelvic pain.  Ultrasound demonstrating  clear 9 cm cyst, left adnexa, suspicious for a simple ovarian cyst,  possible hydrosalpinx CA-125 is normal.  White blood count normal.  Admitted for laparoscopic assessment and removal.  I discussed with the  patient who was involved with the laparoscopic approach to include  multiple port sites, insufflation, trocar placement, sharp blunt  dissection, electrocautery laser and harmonic scalpel.  A realistic  possibility for oophorectomy or salpingo-oophorectomy was discussed,  although will tentatively plan on just removing the cyst if technically  difficult and/or it felt most prudent to remove the entire ovary and  tube or if hydrosalpinx is encountered, removal of the tube was all  reviewed and she gives me permission to do so.  The patient also  understands, although unlikely the possibility of cancer, the issues  of  spillage and possible worsening prognosis with the laparoscopic versus  laparotomy approach.  She also understands if overt cancer is  encountered, will terminate the procedure at that time.  She will follow  up with a gynecologic oncologist for definitive surgery.  The risks of  surgery were reviewed with the patient to include infection, prolonged  antibiotics, incisional complications opening and draining of incisions,  closure by secondary intention, long-term issues, scar formation,  cosmetics, keloid formation and hernia formation all reviewed.  The  risks of hemorrhage necessitating transfusion and risks of transfusion  discussed to include transfusion reaction, hepatitis, HIV, [place]  disease and other unknown entities.  The risks  of inadvertent injury to  internal organs, either immediately recognized or delay recognized  including bowel, bladder, ureters, vessels and nerves necessitating  major exploratory reparative surgeries, speech reparative surgeries  including bladder repair, ureteral damage repair, bowel resection,  ostomy formation was all discussed with her.  The patient's questions  were answered to her satisfaction, and she is ready to proceed with  surgery.      Timothy P. Fontaine, M.D.  Electronically Signed     TPF/MEDQ  D:  01/06/2008  T:  01/06/2008  Job:  [id number]

## 2011-01-06 LAB — URINALYSIS, ROUTINE W REFLEX MICROSCOPIC
Nitrite: NEGATIVE
Nitrite: NEGATIVE
Protein, ur: NEGATIVE
Specific Gravity, Urine: 1.007
Specific Gravity, Urine: 1.024
Urobilinogen, UA: 0.2
pH: 6.5

## 2011-01-06 LAB — COMPREHENSIVE METABOLIC PANEL
ALT: 12
ALT: 13
AST: 15
Albumin: 3.2 — ABNORMAL LOW
Albumin: 3.4 — ABNORMAL LOW
Alkaline Phosphatase: 41
Calcium: 8.8
Calcium: 9.1
GFR calc Af Amer: >60
GFR calc Af Amer: >60
Glucose, Bld: 104 — ABNORMAL HIGH
Glucose, Bld: 99
Potassium: 3.7
Sodium: 139
Sodium: 141
Total Protein: 5.6 — ABNORMAL LOW
Total Protein: 6.2

## 2011-01-06 LAB — PREGNANCY, URINE: Preg Test, Ur: NEGATIVE

## 2011-01-06 LAB — TSH: TSH: 1.096

## 2011-01-06 LAB — DIFFERENTIAL
Basophils Relative: 0
Eosinophils Absolute: 0.1
Eosinophils Absolute: 0.2
Lymphs Abs: 2.1
Lymphs Abs: 2.8
Monocytes Absolute: 0.5
Monocytes Absolute: 0.6
Monocytes Relative: 7
Monocytes Relative: 8
Neutro Abs: 4.3
Neutrophils Relative %: 55
Neutrophils Relative %: 61

## 2011-01-06 LAB — CBC
Hemoglobin: 14
Hemoglobin: 14.7
MCHC: 33.2
MCHC: 33.5
Platelets: 250
Platelets: 278
RDW: 13
RDW: 13.2

## 2011-01-06 LAB — WET PREP, GENITAL
Clue Cells Wet Prep HPF POC: NONE SEEN
Trich, Wet Prep: NONE SEEN
WBC, Wet Prep HPF POC: NONE SEEN

## 2011-01-06 LAB — POCT PREGNANCY, URINE: Preg Test, Ur: NEGATIVE

## 2012-03-23 ENCOUNTER — Emergency Department (HOSPITAL_COMMUNITY)
Admission: EM | Admit: 2012-03-23 | Discharge: 2012-03-23 | Discharge status: Against Medical Advice | Payer: Self-pay | Attending: Emergency Medicine | Admitting: Emergency Medicine

## 2012-03-23 ENCOUNTER — Encounter (HOSPITAL_COMMUNITY): Payer: Self-pay

## 2012-03-23 DIAGNOSIS — R109 Unspecified abdominal pain: Secondary | ICD-10-CM | POA: Insufficient documentation

## 2012-03-23 LAB — PREGNANCY, URINE: Preg Test, Ur: NEGATIVE

## 2012-03-23 LAB — URINALYSIS, ROUTINE W REFLEX MICROSCOPIC
Nitrite: NEGATIVE
Protein, ur: NEGATIVE mg/dL
Urobilinogen, UA: 0.2 mg/dL (ref 0.0–1.0)

## 2012-03-23 NOTE — ED Notes (Signed)
Complain of pain in lower back and and pain in right lower abdomen

## 2012-03-23 NOTE — ED Notes (Signed)
Called to tx area twice. ED reg states they saw pt leaving

## 2012-08-11 ENCOUNTER — Encounter (HOSPITAL_COMMUNITY): Payer: Self-pay

## 2012-08-11 ENCOUNTER — Emergency Department (HOSPITAL_COMMUNITY)
Admission: EM | Admit: 2012-08-11 | Discharge: 2012-08-11 | Disposition: A | Discharge status: Home / Self Care | Payer: Self-pay | Attending: Emergency Medicine | Admitting: Emergency Medicine

## 2012-08-11 DIAGNOSIS — Y92009 Unspecified place in unspecified non-institutional (private) residence as the place of occurrence of the external cause: Secondary | ICD-10-CM | POA: Insufficient documentation

## 2012-08-11 DIAGNOSIS — Z8719 Personal history of other diseases of the digestive system: Secondary | ICD-10-CM | POA: Insufficient documentation

## 2012-08-11 DIAGNOSIS — S335XXA Sprain of ligaments of lumbar spine, initial encounter: Secondary | ICD-10-CM | POA: Insufficient documentation

## 2012-08-11 DIAGNOSIS — J45909 Unspecified asthma, uncomplicated: Secondary | ICD-10-CM | POA: Insufficient documentation

## 2012-08-11 DIAGNOSIS — Y93E6 Activity, residential relocation: Secondary | ICD-10-CM | POA: Insufficient documentation

## 2012-08-11 DIAGNOSIS — E079 Disorder of thyroid, unspecified: Secondary | ICD-10-CM | POA: Insufficient documentation

## 2012-08-11 DIAGNOSIS — S39012A Strain of muscle, fascia and tendon of lower back, initial encounter: Secondary | ICD-10-CM

## 2012-08-11 DIAGNOSIS — X503XXA Overexertion from repetitive movements, initial encounter: Secondary | ICD-10-CM | POA: Insufficient documentation

## 2012-08-11 DIAGNOSIS — Z79899 Other long term (current) drug therapy: Secondary | ICD-10-CM | POA: Insufficient documentation

## 2012-08-11 DIAGNOSIS — E7800 Pure hypercholesterolemia, unspecified: Secondary | ICD-10-CM | POA: Insufficient documentation

## 2012-08-11 DIAGNOSIS — F17200 Nicotine dependence, unspecified, uncomplicated: Secondary | ICD-10-CM | POA: Insufficient documentation

## 2012-08-11 HISTORY — DX: Diverticulosis of intestine, part unspecified, without perforation or abscess without bleeding: K57.90

## 2012-08-11 HISTORY — DX: Unspecified asthma, uncomplicated: J45.909

## 2012-08-11 HISTORY — DX: Pure hypercholesterolemia, unspecified: E78.00

## 2012-08-11 MED ORDER — OXYCODONE-ACETAMINOPHEN 5-325 MG PO TABS: 1.0000 | ORAL_TABLET | Freq: Four times a day (QID) | ORAL | Status: DC | PRN

## 2012-08-11 MED ORDER — CYCLOBENZAPRINE HCL 10 MG PO TABS: 10.0000 mg | ORAL_TABLET | Freq: Two times a day (BID) | ORAL | Status: DC | PRN

## 2012-08-11 NOTE — ED Provider Notes (Signed)
History     CSN: [id number]  Arrival date & time 08/11/12  1039   First MD Initiated Contact with Patient 08/11/12 1144      Chief Complaint  Patient presents with  . Back Pain    (Consider location/radiation/quality/duration/timing/severity/associated sxs/prior treatment) Patient is a 37 y.o. female presenting with back pain. The history is provided by the patient.  Back Pain Location:  Lumbar spine Radiates to:  R thigh Pain severity:  Severe Pain is:  Same all the time Onset quality:  Gradual Duration:  2 days Timing:  Constant Progression:  Unchanged Chronicity:  New Context: lifting heavy objects   Relieved by: slight improvement with hot shower. Worsened by:  Movement, standing, coughing, deep breathing and twisting Ineffective treatments:  Lying down and being still Associated symptoms: leg pain   Associated symptoms: no abdominal pain, no bladder incontinence, no bowel incontinence, no headaches, no numbness and no weakness    Rachel Coleman is a 37 y.o. female who presents to the ED with back pain. The pain started after moving boxes and appliances while moving into a new house.   Past Medical History  Diagnosis Date  . Asthma   . Hypercholesteremia   . Thyroid disease   . Diverticulosis     Past Surgical History  Procedure Laterality Date  . Cyst excision    . Leep    . Tubal ligation    . Appendectomy      No family history on file.  History  Substance Use Topics  . Smoking status: Current Every Day Smoker  . Smokeless tobacco: Not on file  . Alcohol Use: No    OB History   Grav Para Term Preterm Abortions TAB SAB Ect Mult Living                  Review of Systems  Constitutional: Negative for activity change.  HENT: Negative for neck pain.   Gastrointestinal: Negative for abdominal pain and bowel incontinence.  Genitourinary: Negative for bladder incontinence.  Musculoskeletal: Positive for back pain.  Neurological: Negative for  weakness, numbness and headaches.    Allergies  Aspirin; Hydrocodone; and Tramadol  Home Medications   Current Outpatient Rx  Name  Route  Sig  Dispense  Refill  . albuterol (PROVENTIL) 90 MCG/ACT inhaler   Inhalation   Inhale 1 puff into the lungs every 4 (four) hours as needed. for wheeze, use with spacer         . cetirizine (ZYRTEC ALLERGY) 10 MG tablet   Oral   Take 10 mg by mouth daily.           Marland Kitchen dicyclomine (BENTYL) 20 MG tablet   Oral   Take 20 mg by mouth 4 (four) times daily.          . diphenoxylate-atropine (LOMOTIL) 2.5-0.025 MG per tablet   Oral   Take 1 tablet by mouth every 6 (six) hours as needed for diarrhea or loose stools.          . gabapentin (NEURONTIN) 300 MG capsule   Oral   Take 300 mg by mouth 2 (two) times daily.           Marland Kitchen ibuprofen (ADVIL,MOTRIN) 600 MG tablet   Oral   Take 600 mg by mouth 3 (three) times daily as needed. For pain. Take with food         . levothyroxine (SYNTHROID, LEVOTHROID) 112 MCG tablet   Oral   Take 112  mcg by mouth daily.           . meloxicam (MOBIC) 7.5 MG tablet   Oral   Take 7.5 mg by mouth daily.         . ranitidine (ZANTAC) 150 MG capsule   Oral   Take 150 mg by mouth 2 (two) times daily. For reflux          . simvastatin (ZOCOR) 20 MG tablet   Oral   Take 20 mg by mouth every evening.         Marland Kitchen Spacer/Aero-Holding Chambers (BREATHERITE COLL SPACER ADULT) MISC      Use spacer with inhaler at all times.  This helps to get the medicine in your lungs            BP 113/60  Pulse 77  Temp(Src) 97.5 F (36.4 C) (Oral)  Resp 18  Ht 5\' 3"  (1.6 m)  Wt 194 lb 9 oz (88.253 kg)  BMI 34.47 kg/m2  SpO2 99%  LMP 08/04/2012  Physical Exam  Nursing note and vitals reviewed. Constitutional: She is oriented to person, place, and time. She appears well-developed and well-nourished.  HENT:  Head: Normocephalic and atraumatic.  Eyes: EOM are normal.  Neck: Normal range of motion.  Neck supple.  Cardiovascular: Normal rate and regular rhythm.   Pulmonary/Chest: Effort normal and breath sounds normal.  Abdominal: Soft. Bowel sounds are normal. She exhibits no mass. There is no tenderness.  Musculoskeletal: Normal range of motion.       Lumbar back: She exhibits tenderness and spasm. She exhibits normal range of motion and normal pulse.       Back:  Pain with palpation lower lumbar area and right sciatic area. Pain radiates to the right leg.   Neurological: She is alert and oriented to person, place, and time. She has normal strength and normal reflexes. No cranial nerve deficit or sensory deficit.  Radial and pedal pulses presents and strong. Good touch sensation, adequate circulation. Ambulatory without difficulty.   Skin: Skin is warm and dry.  Psychiatric: She has a normal mood and affect. Her behavior is normal. Judgment and thought content normal.    ED Course  Procedures (including critical care time) Assessment: 37 y.o. female with low back pain s/p moving heavy boxes and appliances  Plan:  Pain management   Follow up with ortho prn   Continue NSAIDS from home  MDM  I have reviewed this patient's vital signs, nurses notes. I have discussed clinical findings and plan of care with the patient and she voices understanding.    Medication List    TAKE these medications       cyclobenzaprine 10 MG tablet  Commonly known as:  FLEXERIL  Take 1 tablet (10 mg total) by mouth 2 (two) times daily as needed for muscle spasms.      ASK your doctor about these medications       BreatheRite Coll Spacer Adult Misc  Use spacer with inhaler at all times.  This helps to get the medicine in your lungs     dicyclomine 20 MG tablet  Commonly known as:  BENTYL  Take 20 mg by mouth 4 (four) times daily.     diphenoxylate-atropine 2.5-0.025 MG per tablet  Commonly known as:  LOMOTIL  Take 1 tablet by mouth every 6 (six) hours as needed for diarrhea or loose stools.      gabapentin 300 MG capsule  Commonly known as:  NEURONTIN  Take 300 mg by mouth 2 (two) times daily.     ibuprofen 600 MG tablet  Commonly known as:  ADVIL,MOTRIN  Take 600 mg by mouth 3 (three) times daily as needed. For pain. Take with food     levothyroxine 112 MCG tablet  Commonly known as:  SYNTHROID, LEVOTHROID  Take 112 mcg by mouth daily.     meloxicam 7.5 MG tablet  Commonly known as:  MOBIC  Take 7.5 mg by mouth daily.     PROVENTIL 90 MCG/ACT inhaler  Generic drug:  albuterol  Inhale 1 puff into the lungs every 4 (four) hours as needed. for wheeze, use with spacer     ranitidine 150 MG capsule  Commonly known as:  ZANTAC  Take 150 mg by mouth 2 (two) times daily. For reflux     simvastatin 20 MG tablet  Commonly known as:  ZOCOR  Take 20 mg by mouth every evening.     ZYRTEC ALLERGY 10 MG tablet  Generic drug:  cetirizine  Take 10 mg by mouth daily.             [facility] Orlene Och, NP 08/11/12 1726

## 2012-08-11 NOTE — ED Notes (Signed)
Pt insists on speaking [facility] concerning pain and needing pain meds

## 2012-08-11 NOTE — ED Notes (Signed)
Pt with pain to right buttock that radiates down right leg, started after moving heavy boxes recently, has been ibuprofen, mobic and neurontin without relief

## 2012-08-11 NOTE — ED Notes (Signed)
Pt reports has been moving to a new house and has been lifting boxes and appliances.  Pt c/o lower back pain radiating down r leg.

## 2012-08-16 NOTE — ED Provider Notes (Signed)
Medical screening examination/treatment/procedure(s) were performed by non-physician practitioner and as supervising physician I was immediately available for consultation/collaboration.   Haydon Kalmar W. Shawan Corella, MD 08/16/12 0515 

## 2012-10-20 ENCOUNTER — Encounter (HOSPITAL_COMMUNITY): Payer: Self-pay | Admitting: Emergency Medicine

## 2012-10-20 ENCOUNTER — Encounter (HOSPITAL_COMMUNITY): Payer: Self-pay | Admitting: *Deleted

## 2012-10-20 ENCOUNTER — Emergency Department (HOSPITAL_COMMUNITY)
Admission: EM | Admit: 2012-10-20 | Discharge: 2012-10-21 | Discharge status: Against Medical Advice | Payer: No Typology Code available for payment source | Attending: Emergency Medicine | Admitting: Emergency Medicine

## 2012-10-20 ENCOUNTER — Emergency Department (INDEPENDENT_AMBULATORY_CARE_PROVIDER_SITE_OTHER)
Admission: EM | Admit: 2012-10-20 | Discharge: 2012-10-20 | Disposition: A | Discharge status: Home / Self Care | Payer: Self-pay | Source: Home / Self Care

## 2012-10-20 DIAGNOSIS — Y929 Unspecified place or not applicable: Secondary | ICD-10-CM | POA: Insufficient documentation

## 2012-10-20 DIAGNOSIS — S139XXA Sprain of joints and ligaments of unspecified parts of neck, initial encounter: Secondary | ICD-10-CM

## 2012-10-20 DIAGNOSIS — S39012A Strain of muscle, fascia and tendon of lower back, initial encounter: Secondary | ICD-10-CM

## 2012-10-20 DIAGNOSIS — S335XXA Sprain of ligaments of lumbar spine, initial encounter: Secondary | ICD-10-CM

## 2012-10-20 DIAGNOSIS — Y939 Activity, unspecified: Secondary | ICD-10-CM | POA: Insufficient documentation

## 2012-10-20 DIAGNOSIS — S0993XA Unspecified injury of face, initial encounter: Secondary | ICD-10-CM | POA: Insufficient documentation

## 2012-10-20 DIAGNOSIS — IMO0002 Reserved for concepts with insufficient information to code with codable children: Secondary | ICD-10-CM | POA: Insufficient documentation

## 2012-10-20 DIAGNOSIS — M549 Dorsalgia, unspecified: Secondary | ICD-10-CM

## 2012-10-20 DIAGNOSIS — S161XXA Strain of muscle, fascia and tendon at neck level, initial encounter: Secondary | ICD-10-CM

## 2012-10-20 MED ORDER — ACETAMINOPHEN-CODEINE #3 300-30 MG PO TABS: 1.0000 | ORAL_TABLET | Freq: Four times a day (QID) | ORAL | Status: DC | PRN

## 2012-10-20 MED ORDER — CYCLOBENZAPRINE HCL 10 MG PO TABS: 10.0000 mg | ORAL_TABLET | Freq: Two times a day (BID) | ORAL | Status: DC | PRN
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
## 2012-10-20 NOTE — ED Provider Notes (Signed)
Medical screening examination/treatment/procedure(s) were performed by non-physician practitioner and as supervising physician I was immediately available for consultation/collaboration.  Raynald Blend, MD 10/20/12 1710

## 2012-10-20 NOTE — Discharge Instructions (Signed)
Back Injury Prevention °Back injuries can be extremely painful and difficult to heal. After having one back injury, you are much more likely to experience another later on. It is important to learn how to avoid injuring or re-injuring your back. The following tips can help you to prevent a back injury. °PHYSICAL FITNESS °· Exercise regularly and try to develop good tone in your abdominal muscles. Your abdominal muscles provide a lot of the support needed by your back. °· Do aerobic exercises (walking, jogging, biking, swimming) regularly. °· Do exercises that increase balance and strength (tai chi, yoga) regularly. This can decrease your risk of falling and injuring your back. °· Stretch before and after exercising. °· Maintain a healthy weight. The more you weigh, the more stress is placed on your back. For every pound of weight, 10 times that amount of pressure is placed on the back. °DIET °· Talk to your caregiver about how much calcium and vitamin D you need per day. These nutrients help to prevent weakening of the bones (osteoporosis). Osteoporosis can cause broken (fractured) bones that lead to back pain. °· Include good sources of calcium in your diet, such as dairy products, green, leafy vegetables, and products with calcium added (fortified). °· Include good sources of vitamin D in your diet, such as milk and foods that are fortified with vitamin D. °· Consider taking a nutritional supplement or a multivitamin if needed. °· Stop smoking if you smoke. °POSTURE °· Sit and stand up straight. Avoid leaning forward when you sit or hunching over when you stand. °· Choose chairs with good low back (lumbar) support. °· If you work at a desk, sit close to your work so you do not need to lean over. Keep your chin tucked in. Keep your neck drawn back and elbows bent at a right angle. Your arms should look like the letter "L." °· Sit high and close to the steering wheel when you drive. Add a lumbar support to your car  seat if needed. °· Avoid sitting or standing in one position for too long. Take breaks to get up, stretch, and walk around at least once every hour. Take breaks if you are driving for long periods of time. °· Sleep on your side with your knees slightly bent, or sleep on your back with a pillow under your knees. Do not sleep on your stomach. °LIFTING, TWISTING, AND REACHING °· Avoid heavy lifting, especially repetitive lifting. If you must do heavy lifting: °· Stretch before lifting. °· Work slowly. °· Rest between lifts. °· Use carts and dollies to move objects when possible. °· Make several small trips instead of carrying 1 heavy load. °· Ask for help when you need it. °· Ask for help when moving big, awkward objects. °· Follow these steps when lifting: °· Stand with your feet shoulder-width apart. °· Get as close to the object as you can. Do not try to pick up heavy objects that are far from your body. °· Use handles or lifting straps if they are available. °· Bend at your knees. Squat down, but keep your heels off the floor. °· Keep your shoulders pulled back, your chin tucked in, and your back straight. °· Lift the object slowly, tightening the muscles in your legs, abdomen, and buttocks. Keep the object as close to the center of your body as possible. °· When you put a load down, use these same guidelines in reverse. °· Do not: °· Lift the object above your waist. °·   Twist at the waist while lifting or carrying a load. Move your feet if you need to turn, not your waist.  Bend over without bending at your knees.  Avoid reaching over your head, across a table, or for an object on a high surface. OTHER TIPS  Avoid wet floors and keep sidewalks clear of ice to prevent falls.  Do not sleep on a mattress that is too soft or too hard.  Keep items that are used frequently within easy reach.  Put heavier objects on shelves at waist level and lighter objects on lower or higher shelves.  Find ways to  decrease your stress, such as exercise, massage, or relaxation techniques. Stress can build up in your muscles. Tense muscles are more vulnerable to injury.  Seek treatment for depression or anxiety if needed. These conditions can increase your risk of developing back pain. SEEK MEDICAL CARE IF:  You injure your back.  You have questions about diet, exercise, or other ways to prevent back injuries. MAKE SURE YOU:  Understand these instructions.  Will watch your condition.  Will get help right away if you are not doing well or get worse. Document Released: 05/01/2004 Document Revised: 06/16/2011 Document Reviewed: 05/05/2011 [facility] Patient Information 2014 [place], [place].  Back Pain, Adult Low back pain is very common. About 1 in 5 people have back pain.The cause of low back pain is rarely dangerous. The pain often gets better over time.About half of people with a sudden onset of back pain feel better in just 2 weeks. About 8 in 10 people feel better by 6 weeks.  CAUSES Some common causes of back pain include:  Strain of the muscles or ligaments supporting the spine.  Wear and tear (degeneration) of the spinal discs.  Arthritis.  Direct injury to the back. DIAGNOSIS Most of the time, the direct cause of low back pain is not known.However, back pain can be treated effectively even when the exact cause of the pain is unknown.Answering your caregiver's questions about your overall health and symptoms is one of the most accurate ways to make sure the cause of your pain is not dangerous. If your caregiver needs more information, he or she may order lab work or imaging tests (X-rays or MRIs).However, even if imaging tests show changes in your back, this usually does not require surgery. HOME CARE INSTRUCTIONS For many people, back pain returns.Since low back pain is rarely dangerous, it is often a condition that people can learn to [facility] their own.   Remain active. It is  stressful on the back to sit or stand in one place. Do not sit, drive, or stand in one place for more than 30 minutes at a time. Take short walks on level surfaces as soon as pain allows.Try to increase the length of time you walk each day.  Do not stay in bed.Resting more than 1 or 2 days can delay your recovery.  Do not avoid exercise or work.Your body is made to move.It is not dangerous to be active, even though your back may hurt.Your back will likely heal faster if you return to being active before your pain is gone.  Pay attention to your body when you bend and lift. Many people have less discomfortwhen lifting if they bend their knees, keep the load close to their bodies,and avoid twisting. Often, the most comfortable positions are those that put less stress on your recovering back.  Find a comfortable position to sleep. Use a firm mattress and  lie on your side with your knees slightly bent. If you lie on your back, put a pillow under your knees.  Only take over-the-counter or prescription medicines as directed by your caregiver. Over-the-counter medicines to reduce pain and inflammation are often the most helpful.Your caregiver may prescribe muscle relaxant drugs.These medicines help dull your pain so you can more quickly return to your normal activities and healthy exercise.  Put ice on the injured area.  Put ice in a plastic bag.  Place a towel between your skin and the bag.  Leave the ice on for 15-20 minutes, 3-4 times a day for the first 2 to 3 days. After that, ice and heat may be alternated to reduce pain and spasms.  Ask your caregiver about trying back exercises and gentle massage. This may be of some benefit.  Avoid feeling anxious or stressed.Stress increases muscle tension and can worsen back pain.It is important to recognize when you are anxious or stressed and learn ways to manage it.Exercise is a great option. SEEK MEDICAL CARE IF:  You have pain that is  not relieved with rest or medicine.  You have pain that does not improve in 1 week.  You have new symptoms.  You are generally not feeling well. SEEK IMMEDIATE MEDICAL CARE IF:   You have pain that radiates from your back into your legs.  You develop new bowel or bladder control problems.  You have unusual weakness or numbness in your arms or legs.  You develop nausea or vomiting.  You develop abdominal pain.  You feel faint. Document Released: 03/24/2005 Document Revised: 09/23/2011 Document Reviewed: 08/12/2010 [facility] Patient Information 2014 [place], [place].  Cervical Sprain A cervical sprain is when the ligaments in the neck stretch or tear. The ligaments are the tissues that hold the neck bones in place. HOME CARE   Put ice on the injured area.  Put ice in a plastic bag.  Place a towel between your skin and the bag.  Leave the ice on for 15-20 minutes, 3-4 times a day.  Only take medicine as told by your doctor.  Keep all doctor visits as told.  Keep all physical therapy visits as told.  If your doctor gives you a neck collar, wear it as told.  Do not drive while wearing a neck collar.  Adjust your work station so that you have good posture while you work.  Avoid positions and activities that make your problems worse.  Warm up and stretch before being active. GET HELP RIGHT AWAY IF:   You are bleeding or your stomach is upset.  You have an allergic reaction to your medicine.  Your problems (symptoms) get worse.  You develop new problems.  You lose feeling (numbness) or you cannot move (paralysis) any part of your body.  You have tingling or weakness in any part of your body.  Your pain is not controlled with medicine.  You cannot take less pain medicine over time as planned.  Your activity level does not improve as expected. MAKE SURE YOU:   Understand these instructions.  Will watch your condition.  Will get help right away if you are  not doing well or get worse. Document Released: 09/10/2007 Document Revised: 06/16/2011 Document Reviewed: 12/26/2010 [facility] Patient Information 2014 [place], [place].  Low Back Strain with Rehab A strain is an injury in which a tendon or muscle is torn. The muscles and tendons of the lower back are vulnerable to strains. However, these muscles and tendons  are very strong and require a great force to be injured. Strains are classified into three categories. Grade 1 strains cause pain, but the tendon is not lengthened. Grade 2 strains include a lengthened ligament, due to the ligament being stretched or partially ruptured. With grade 2 strains there is still function, although the function may be decreased. Grade 3 strains involve a complete tear of the tendon or muscle, and function is usually impaired. SYMPTOMS   Pain in the lower back.  Pain that affects one side more than the other.  Pain that gets worse with movement and may be felt in the hip, buttocks, or back of the thigh.  Muscle spasms of the muscles in the back.  Swelling along the muscles of the back.  Loss of strength of the back muscles.  Crackling sound (crepitation) when the muscles are touched. CAUSES  Lower back strains occur when a force is placed on the muscles or tendons that is greater than they can handle. Common causes of injury include:  Prolonged overuse of the muscle-tendon units in the lower back, usually from incorrect posture.  A single violent injury or force applied to the back. RISK INCREASES WITH:  Sports that involve twisting forces on the spine or a lot of bending at the waist (football, rugby, weightlifting, bowling, golf, tennis, speed skating, racquetball, swimming, running, gymnastics, diving).  Poor strength and flexibility.  Failure to warm up properly before activity.  Family history of lower back pain or disk disorders.  Previous back injury or surgery (especially fusion).  Poor  posture with lifting, especially heavy objects.  Prolonged sitting, especially with poor posture. PREVENTION   Learn and use proper posture when sitting or lifting (maintain proper posture when sitting, lift using the knees and legs, not at the waist).  Warm up and stretch properly before activity.  Allow for adequate recovery between workouts.  Maintain physical fitness:  Strength, flexibility, and endurance.  Cardiovascular fitness. PROGNOSIS  If treated properly, lower back strains usually heal within 6 weeks. RELATED COMPLICATIONS   Recurring symptoms, resulting in a chronic problem.  Chronic inflammation, scarring, and partial muscle-tendon tear.  Delayed healing or resolution of symptoms.  Prolonged disability. TREATMENT  Treatment first involves the use of ice and medicine, to reduce pain and inflammation. The use of strengthening and stretching exercises may help reduce pain with activity. These exercises may be performed at home or with a therapist. Severe injuries may require referral to a therapist for further evaluation and treatment, such as ultrasound. Your caregiver may advise that you wear a back brace or corset, to help reduce pain and discomfort. Often, prolonged bed rest results in greater harm then benefit. Corticosteroid injections may be recommended. However, these should be reserved for the most serious cases. It is important to avoid using your back when lifting objects. At night, sleep on your back on a firm mattress with a pillow placed under your knees. If non-surgical treatment is unsuccessful, surgery may be needed.  MEDICATION   If pain medicine is needed, nonsteroidal anti-inflammatory medicines (aspirin and ibuprofen), or other minor pain relievers (acetaminophen), are often advised.  Do not take pain medicine for 7 days before surgery.  Prescription pain relievers may be given, if your caregiver thinks they are needed. Use only as directed and only  as much as you need.  Ointments applied to the skin may be helpful.  Corticosteroid injections may be given by your caregiver. These injections should be reserved for the most  serious cases, because they may only be given a certain number of times. HEAT AND COLD  Cold treatment (icing) should be applied for 10 to 15 minutes every 2 to 3 hours for inflammation and pain, and immediately after activity that aggravates your symptoms. Use ice packs or an ice massage.  Heat treatment may be used before performing stretching and strengthening activities prescribed by your caregiver, physical therapist, or athletic trainer. Use a heat pack or a warm water soak. SEEK MEDICAL CARE IF:   Symptoms get worse or do not improve in 2 to 4 weeks, despite treatment.  You develop numbness, weakness, or loss of bowel or bladder function.  New, unexplained symptoms develop. (Drugs used in treatment may produce side effects.) EXERCISES  RANGE OF MOTION (ROM) AND STRETCHING EXERCISES - Low Back Strain Most people with lower back pain will find that their symptoms get worse with excessive bending forward (flexion) or arching at the lower back (extension). The exercises which will help resolve your symptoms will focus on the opposite motion.  Your physician, physical therapist or athletic trainer will help you determine which exercises will be most helpful to resolve your lower back pain. Do not complete any exercises without first consulting with your caregiver. Discontinue any exercises which make your symptoms worse until you speak to your caregiver.  If you have pain, numbness or tingling which travels down into your buttocks, leg or foot, the goal of the therapy is for these symptoms to move closer to your back and eventually resolve. Sometimes, these leg symptoms will get better, but your lower back pain may worsen. This is typically an indication of progress in your rehabilitation. Be very alert to any changes in  your symptoms and the activities in which you participated in the 24 hours prior to the change. Sharing this information with your caregiver will allow him/her to most efficiently treat your condition.  These exercises may help you when beginning to rehabilitate your injury. Your symptoms may resolve with or without further involvement from your physician, physical therapist or athletic trainer. While completing these exercises, remember:  Restoring tissue flexibility helps normal motion to return to the joints. This allows healthier, less painful movement and activity.  An effective stretch should be held for at least 30 seconds.  A stretch should never be painful. You should only feel a gentle lengthening or release in the stretched tissue. FLEXION RANGE OF MOTION AND STRETCHING EXERCISES: STRETCH  Flexion, Single Knee to Chest   Lie on a firm bed or floor with both legs extended in front of you.  Keeping one leg in contact with the floor, bring your opposite knee to your chest. Hold your leg in place by either grabbing behind your thigh or at your knee.  Pull until you feel a gentle stretch in your lower back. Hold __________ seconds.  Slowly release your grasp and repeat the exercise with the opposite side. Repeat __________ times. Complete this exercise __________ times per day.  STRETCH  Flexion, Double Knee to Chest   Lie on a firm bed or floor with both legs extended in front of you.  Keeping one leg in contact with the floor, bring your opposite knee to your chest.  Tense your stomach muscles to support your back and then lift your other knee to your chest. Hold your legs in place by either grabbing behind your thighs or at your knees.  Pull both knees toward your chest until you feel a gentle stretch  in your lower back. Hold __________ seconds.  Tense your stomach muscles and slowly return one leg at a time to the floor. Repeat __________ times. Complete this exercise  __________ times per day.  STRETCH  Low Trunk Rotation  Lie on a firm bed or floor. Keeping your legs in front of you, bend your knees so they are both pointed toward the ceiling and your feet are flat on the floor.  Extend your arms out to the side. This will stabilize your upper body by keeping your shoulders in contact with the floor.  Gently and slowly drop both knees together to one side until you feel a gentle stretch in your lower back. Hold for __________ seconds.  Tense your stomach muscles to support your lower back as you bring your knees back to the starting position. Repeat the exercise to the other side. Repeat __________ times. Complete this exercise __________ times per day  EXTENSION RANGE OF MOTION AND FLEXIBILITY EXERCISES: STRETCH  Extension, Prone on Elbows   Lie on your stomach on the floor, a bed will be too soft. Place your palms about shoulder width apart and at the height of your head.  Place your elbows under your shoulders. If this is too painful, stack pillows under your chest.  Allow your body to relax so that your hips drop lower and make contact more completely with the floor.  Hold this position for __________ seconds.  Slowly return to lying flat on the floor. Repeat __________ times. Complete this exercise __________ times per day.  RANGE OF MOTION  Extension, Prone Press Ups  Lie on your stomach on the floor, a bed will be too soft. Place your palms about shoulder width apart and at the height of your head.  Keeping your back as relaxed as possible, slowly straighten your elbows while keeping your hips on the floor. You may adjust the placement of your hands to maximize your comfort. As you gain motion, your hands will come more underneath your shoulders.  Hold this position __________ seconds.  Slowly return to lying flat on the floor. Repeat __________ times. Complete this exercise __________ times per day.  RANGE OF MOTION- Quadruped, Neutral  Spine   Assume a hands and knees position on a firm surface. Keep your hands under your shoulders and your knees under your hips. You may place padding under your knees for comfort.  Drop your head and point your tail bone toward the ground below you. This will round out your lower back like an angry cat. Hold this position for __________ seconds.  Slowly lift your head and release your tail bone so that your back sags into a large arch, like an old horse.  Hold this position for __________ seconds.  Repeat this until you feel limber in your lower back.  Now, find your "sweet spot." This will be the most comfortable position somewhere between the two previous positions. This is your neutral spine. Once you have found this position, tense your stomach muscles to support your lower back.  Hold this position for __________ seconds. Repeat __________ times. Complete this exercise __________ times per day.  STRENGTHENING EXERCISES - Low Back Strain These exercises may help you when beginning to rehabilitate your injury. These exercises should be done near your "sweet spot." This is the neutral, low-back arch, somewhere between fully rounded and fully arched, that is your least painful position. When performed in this safe range of motion, these exercises can be used for people  who have either a flexion or extension based injury. These exercises may resolve your symptoms with or without further involvement from your physician, physical therapist or athletic trainer. While completing these exercises, remember:   Muscles can gain both the endurance and the strength needed for everyday activities through controlled exercises.  Complete these exercises as instructed by your physician, physical therapist or athletic trainer. Increase the resistance and repetitions only as guided.  You may experience muscle soreness or fatigue, but the pain or discomfort you are trying to eliminate should never worsen  during these exercises. If this pain does worsen, stop and make certain you are following the directions exactly. If the pain is still present after adjustments, discontinue the exercise until you can discuss the trouble with your caregiver. STRENGTHENING Deep Abdominals, Pelvic Tilt  Lie on a firm bed or floor. Keeping your legs in front of you, bend your knees so they are both pointed toward the ceiling and your feet are flat on the floor.  Tense your lower abdominal muscles to press your lower back into the floor. This motion will rotate your pelvis so that your tail bone is scooping upwards rather than pointing at your feet or into the floor.  With a gentle tension and even breathing, hold this position for __________ seconds. Repeat __________ times. Complete this exercise __________ times per day.  STRENGTHENING  Abdominals, Crunches   Lie on a firm bed or floor. Keeping your legs in front of you, bend your knees so they are both pointed toward the ceiling and your feet are flat on the floor. Cross your arms over your chest.  Slightly tip your chin down without bending your neck.  Tense your abdominals and slowly lift your trunk high enough to just clear your shoulder blades. Lifting higher can put excessive stress on the lower back and does not further strengthen your abdominal muscles.  Control your return to the starting position. Repeat __________ times. Complete this exercise __________ times per day.  STRENGTHENING  Quadruped, Opposite UE/LE Lift   Assume a hands and knees position on a firm surface. Keep your hands under your shoulders and your knees under your hips. You may place padding under your knees for comfort.  Find your neutral spine and gently tense your abdominal muscles so that you can maintain this position. Your shoulders and hips should form a rectangle that is parallel with the floor and is not twisted.  Keeping your trunk steady, lift your right hand no higher  than your shoulder and then your left leg no higher than your hip. Make sure you are not holding your breath. Hold this position __________ seconds.  Continuing to keep your abdominal muscles tense and your back steady, slowly return to your starting position. Repeat with the opposite arm and leg. Repeat __________ times. Complete this exercise __________ times per day.  STRENGTHENING  Lower Abdominals, Double Knee Lift  Lie on a firm bed or floor. Keeping your legs in front of you, bend your knees so they are both pointed toward the ceiling and your feet are flat on the floor.  Tense your abdominal muscles to brace your lower back and slowly lift both of your knees until they come over your hips. Be certain not to hold your breath.  Hold __________ seconds. Using your abdominal muscles, return to the starting position in a slow and controlled manner. Repeat __________ times. Complete this exercise __________ times per

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
## 2012-10-21 NOTE — ED Notes (Signed)
Called for pt in waiting room x2. No answer.

## 2012-10-22 NOTE — ED Provider Notes (Signed)
Medical screening examination/treatment/procedure(s) were performed by non-physician practitioner and as supervising physician I was immediately available for consultation/collaboration.  Sunnie Nielsen, MD 10/22/12 [phone]

## 2012-10-28 ENCOUNTER — Emergency Department (HOSPITAL_COMMUNITY)
Admission: EM | Admit: 2012-10-28 | Discharge: 2012-10-28 | Disposition: A | Discharge status: Home / Self Care | Payer: No Typology Code available for payment source | Attending: Emergency Medicine | Admitting: Emergency Medicine

## 2012-10-28 ENCOUNTER — Emergency Department (HOSPITAL_COMMUNITY): Payer: No Typology Code available for payment source

## 2012-10-28 ENCOUNTER — Encounter (HOSPITAL_COMMUNITY): Payer: Self-pay | Admitting: *Deleted

## 2012-10-28 DIAGNOSIS — F17200 Nicotine dependence, unspecified, uncomplicated: Secondary | ICD-10-CM | POA: Insufficient documentation

## 2012-10-28 DIAGNOSIS — IMO0001 Reserved for inherently not codable concepts without codable children: Secondary | ICD-10-CM | POA: Insufficient documentation

## 2012-10-28 DIAGNOSIS — R5381 Other malaise: Secondary | ICD-10-CM | POA: Insufficient documentation

## 2012-10-28 DIAGNOSIS — M549 Dorsalgia, unspecified: Secondary | ICD-10-CM | POA: Insufficient documentation

## 2012-10-28 DIAGNOSIS — R5383 Other fatigue: Secondary | ICD-10-CM | POA: Insufficient documentation

## 2012-10-28 DIAGNOSIS — Z87828 Personal history of other (healed) physical injury and trauma: Secondary | ICD-10-CM | POA: Insufficient documentation

## 2012-10-28 DIAGNOSIS — E079 Disorder of thyroid, unspecified: Secondary | ICD-10-CM | POA: Insufficient documentation

## 2012-10-28 DIAGNOSIS — E7800 Pure hypercholesterolemia, unspecified: Secondary | ICD-10-CM | POA: Insufficient documentation

## 2012-10-28 DIAGNOSIS — J45909 Unspecified asthma, uncomplicated: Secondary | ICD-10-CM | POA: Insufficient documentation

## 2012-10-28 DIAGNOSIS — Z8719 Personal history of other diseases of the digestive system: Secondary | ICD-10-CM | POA: Insufficient documentation

## 2012-10-28 DIAGNOSIS — G8911 Acute pain due to trauma: Secondary | ICD-10-CM | POA: Insufficient documentation

## 2012-10-28 DIAGNOSIS — Z79899 Other long term (current) drug therapy: Secondary | ICD-10-CM | POA: Insufficient documentation

## 2012-10-28 DIAGNOSIS — M542 Cervicalgia: Secondary | ICD-10-CM | POA: Insufficient documentation

## 2012-10-28 MED ORDER — DIAZEPAM 5 MG PO TABS
5.0000 mg | ORAL_TABLET | Freq: Once | ORAL | Status: AC
  Administered 2012-10-28: 5 mg via ORAL
  Filled 2012-10-28: qty 1

## 2012-10-28 MED ORDER — METHOCARBAMOL 500 MG PO TABS: 500.0000 mg | ORAL_TABLET | Freq: Three times a day (TID) | ORAL | Status: DC | PRN

## 2012-10-28 NOTE — ED Provider Notes (Signed)
Medical screening examination/treatment/procedure(s) were performed by non-physician practitioner and as supervising physician I was immediately available for consultation/collaboration.   Laelah Siravo B. Aniko Finnigan, MD 10/28/12 [phone] 
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
## 2012-12-13 ENCOUNTER — Emergency Department (HOSPITAL_COMMUNITY)
Admission: EM | Admit: 2012-12-13 | Discharge: 2012-12-14 | Disposition: A | Discharge status: Home / Self Care | Payer: No Typology Code available for payment source | Attending: Emergency Medicine | Admitting: Emergency Medicine

## 2012-12-13 ENCOUNTER — Encounter (HOSPITAL_COMMUNITY): Payer: Self-pay | Admitting: Emergency Medicine

## 2012-12-13 DIAGNOSIS — G8929 Other chronic pain: Secondary | ICD-10-CM | POA: Insufficient documentation

## 2012-12-13 DIAGNOSIS — J45909 Unspecified asthma, uncomplicated: Secondary | ICD-10-CM | POA: Insufficient documentation

## 2012-12-13 DIAGNOSIS — M545 Low back pain, unspecified: Secondary | ICD-10-CM | POA: Insufficient documentation

## 2012-12-13 DIAGNOSIS — Z8719 Personal history of other diseases of the digestive system: Secondary | ICD-10-CM | POA: Insufficient documentation

## 2012-12-13 DIAGNOSIS — E079 Disorder of thyroid, unspecified: Secondary | ICD-10-CM | POA: Insufficient documentation

## 2012-12-13 DIAGNOSIS — Z888 Allergy status to other drugs, medicaments and biological substances status: Secondary | ICD-10-CM | POA: Insufficient documentation

## 2012-12-13 DIAGNOSIS — F17200 Nicotine dependence, unspecified, uncomplicated: Secondary | ICD-10-CM | POA: Insufficient documentation

## 2012-12-13 DIAGNOSIS — E7800 Pure hypercholesterolemia, unspecified: Secondary | ICD-10-CM | POA: Insufficient documentation

## 2012-12-13 DIAGNOSIS — Z79899 Other long term (current) drug therapy: Secondary | ICD-10-CM | POA: Insufficient documentation

## 2012-12-13 NOTE — ED Notes (Signed)
Pt. reports progressing low back pain radiating to both legs onset today after walking several hours. Denies injury or fall.

## 2012-12-14 MED ORDER — HYDROCODONE-ACETAMINOPHEN 5-325 MG PO TABS: 1.0000 | ORAL_TABLET | Freq: Four times a day (QID) | ORAL | Status: DC | PRN

## 2012-12-14 MED ORDER — ACETAMINOPHEN-CODEINE #3 300-30 MG PO TABS
1.0000 | ORAL_TABLET | Freq: Once | ORAL | Status: AC
  Administered 2012-12-14: 1 via ORAL
  Filled 2012-12-14: qty 1

## 2012-12-14 MED ORDER — CYCLOBENZAPRINE HCL 5 MG PO TABS: 5.0000 mg | ORAL_TABLET | Freq: Three times a day (TID) | ORAL | Status: DC | PRN

## 2012-12-14 MED ORDER — CYCLOBENZAPRINE HCL 10 MG PO TABS
5.0000 mg | ORAL_TABLET | Freq: Once | ORAL | Status: AC
  Administered 2012-12-14: 5 mg via ORAL
  Filled 2012-12-14: qty 1

## 2012-12-14 NOTE — ED Notes (Signed)
Pt st's she has had pain in lower back radiating down both legs for a while but today the pain has gotten worse after walking

## 2012-12-14 NOTE — ED Provider Notes (Signed)
Medical screening examination/treatment/procedure(s) were performed by non-physician practitioner and as supervising physician I was immediately available for consultation/collaboration.  Sunnie Nielsen, MD 12/14/12 [phone]
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
## 2013-01-25 ENCOUNTER — Encounter (HOSPITAL_COMMUNITY): Payer: Self-pay | Admitting: Emergency Medicine

## 2013-01-25 ENCOUNTER — Emergency Department (HOSPITAL_COMMUNITY)
Admission: EM | Admit: 2013-01-25 | Discharge: 2013-01-25 | Disposition: A | Discharge status: Home / Self Care | Payer: No Typology Code available for payment source | Attending: Emergency Medicine | Admitting: Emergency Medicine

## 2013-01-25 DIAGNOSIS — E7800 Pure hypercholesterolemia, unspecified: Secondary | ICD-10-CM | POA: Insufficient documentation

## 2013-01-25 DIAGNOSIS — Z792 Long term (current) use of antibiotics: Secondary | ICD-10-CM | POA: Insufficient documentation

## 2013-01-25 DIAGNOSIS — F17200 Nicotine dependence, unspecified, uncomplicated: Secondary | ICD-10-CM | POA: Insufficient documentation

## 2013-01-25 DIAGNOSIS — IMO0002 Reserved for concepts with insufficient information to code with codable children: Secondary | ICD-10-CM | POA: Insufficient documentation

## 2013-01-25 DIAGNOSIS — Z8781 Personal history of (healed) traumatic fracture: Secondary | ICD-10-CM | POA: Insufficient documentation

## 2013-01-25 DIAGNOSIS — E079 Disorder of thyroid, unspecified: Secondary | ICD-10-CM | POA: Insufficient documentation

## 2013-01-25 DIAGNOSIS — J45909 Unspecified asthma, uncomplicated: Secondary | ICD-10-CM | POA: Insufficient documentation

## 2013-01-25 DIAGNOSIS — Z79899 Other long term (current) drug therapy: Secondary | ICD-10-CM | POA: Insufficient documentation

## 2013-01-25 DIAGNOSIS — K029 Dental caries, unspecified: Secondary | ICD-10-CM | POA: Insufficient documentation

## 2013-01-25 DIAGNOSIS — K0889 Other specified disorders of teeth and supporting structures: Secondary | ICD-10-CM

## 2013-01-25 DIAGNOSIS — K089 Disorder of teeth and supporting structures, unspecified: Secondary | ICD-10-CM | POA: Insufficient documentation

## 2013-01-25 MED ORDER — HYDROCODONE-ACETAMINOPHEN 5-325 MG PO TABS: 1.0000 | ORAL_TABLET | ORAL | Status: DC | PRN

## 2013-01-25 MED ORDER — AMOXICILLIN 250 MG PO CAPS
500.0000 mg | ORAL_CAPSULE | Freq: Once | ORAL | Status: AC
  Administered 2013-01-25: 500 mg via ORAL
  Filled 2013-01-25: qty 2

## 2013-01-25 MED ORDER — AMOXICILLIN 500 MG PO CAPS: 500.0000 mg | ORAL_CAPSULE | Freq: Three times a day (TID) | ORAL | Status: DC

## 2013-01-25 MED ORDER — HYDROCODONE-ACETAMINOPHEN 7.5-325 MG/15ML PO SOLN
10.0000 mL | Freq: Once | ORAL | Status: AC
  Administered 2013-01-25: 10 mL via ORAL
  Filled 2013-01-25: qty 15

## 2013-01-25 NOTE — ED Provider Notes (Signed)
Medical screening examination/treatment/procedure(s) were performed by non-physician practitioner and as supervising physician I was immediately available for consultation/collaboration.  Geoffery Lyons, MD 01/25/13 [phone]

## 2013-01-25 NOTE — ED Notes (Addendum)
Patient to ED c/o dental pain that has "been going on for a while". Patient states that she has a tooth that broke off in her mouth for about a month. Patient states that she cannot deal with it any longer. Patient states she has had a hard time eating because of the pain. Patient alert and oriented X4. Patient in no apparent distress. Patient appears agitated because of the tooth pain, holding side of her mouth at times during triage.

## 2013-01-25 NOTE — ED Provider Notes (Signed)
CSN: [id number]     Arrival date & time 01/25/13  1805 History   First MD Initiated Contact with Patient 01/25/13 1809     Chief Complaint  Patient presents with  . Dental Pain   (Consider location/radiation/quality/duration/timing/severity/associated sxs/prior Treatment) HPI Comments: Rachel Coleman is a 37 y.o. Female presenting with a 2 day history of dental pain and gingival swelling.   The patient has a history of injury, decay and fracture of the affected tooth several months ago with intermittent episodes of pain,  Worsened and persistent for 2 days.  She does have cold sensitivity.  There has been no fevers,  Chills, nausea or vomiting, also no complaint of difficulty swallowing,  Although chewing makes pain worse.  The patient has tried tylenol without relief of symptoms.         The history is provided by the patient.    Past Medical History  Diagnosis Date  . Asthma   . Hypercholesteremia   . Thyroid disease   . Diverticulosis    Past Surgical History  Procedure Laterality Date  . Cyst excision    . Leep    . Tubal ligation    . Appendectomy     Family History  Problem Relation Age of Onset  . Hypertension Mother   . Thyroid disease Mother   . Glaucoma Mother    History  Substance Use Topics  . Smoking status: Current Every Day Smoker -- 0.50 packs/day    Types: Cigarettes  . Smokeless tobacco: Not on file  . Alcohol Use: No   OB History   Grav Para Term Preterm Abortions TAB SAB Ect Mult Living                 Review of Systems  Constitutional: Negative for fever.  HENT: Positive for dental problem. Negative for facial swelling and sore throat.   Respiratory: Negative for shortness of breath.   Musculoskeletal: Negative for neck pain and neck stiffness.    Allergies  Aspirin and Tramadol  Home Medications   Current Outpatient Rx  Name  Route  Sig  Dispense  Refill  . albuterol (PROVENTIL) 90 MCG/ACT inhaler   Inhalation   Inhale 1  puff into the lungs every 4 (four) hours as needed for wheezing or shortness of breath.          Marland Kitchen amoxicillin (AMOXIL) 500 MG capsule   Oral   Take 1 capsule (500 mg total) by mouth 3 (three) times daily.   30 capsule   0   . atorvastatin (LIPITOR) 20 MG tablet   Oral   Take 20 mg by mouth every morning.         . cetirizine (ZYRTEC ALLERGY) 10 MG tablet   Oral   Take 10 mg by mouth daily.           . cyclobenzaprine (FLEXERIL) 10 MG tablet   Oral   Take 10 mg by mouth 2 (two) times daily as needed for muscle spasms.         Marland Kitchen dicyclomine (BENTYL) 20 MG tablet   Oral   Take 20 mg by mouth 4 (four) times daily.          . fluticasone (FLOVENT HFA) 110 MCG/ACT inhaler   Inhalation   Inhale 1 puff into the lungs 2 (two) times daily.         Marland Kitchen HYDROcodone-acetaminophen (NORCO/VICODIN) 5-325 MG per tablet   Oral   Take 1 tablet by  mouth every 6 (six) hours as needed for pain.   15 tablet   0   . HYDROcodone-acetaminophen (NORCO/VICODIN) 5-325 MG per tablet   Oral   Take 1 tablet by mouth every 4 (four) hours as needed for pain.   15 tablet   0   . levothyroxine (SYNTHROID, LEVOTHROID) 112 MCG tablet   Oral   Take 112 mcg by mouth daily.           . ranitidine (ZANTAC) 150 MG tablet   Oral   Take 150 mg by mouth 2 (two) times daily.         Marland Kitchen Spacer/Aero-Holding Chambers (BREATHERITE COLL SPACER ADULT) MISC      Use spacer with inhaler at all times.  This helps to get the medicine in your lungs           BP 131/79  Pulse 71  Temp(Src) 97.9 F (36.6 C) (Oral)  Resp 20  SpO2 100%  LMP 12/25/2012 Physical Exam  Constitutional: She is oriented to person, place, and time. She appears well-developed and well-nourished. No distress.  HENT:  Head: Normocephalic and atraumatic.  Right Ear: Tympanic membrane and external ear normal.  Left Ear: Tympanic membrane and external ear normal.  Mouth/Throat: Oropharynx is clear and moist and mucous  membranes are normal. No oral lesions. No trismus in the jaw. Abnormal dentition. Dental caries present. No dental abscesses.  Generalized poor dentition with left upper 1st molar tooth with fracture to the gingival line,  Surrounding erythema without fluctuance.  No facial edema or erythema. She has multiple dental extractions and old fillings.  Eyes: Conjunctivae are normal.  Neck: Normal range of motion. Neck supple.  Cardiovascular: Normal rate and normal heart sounds.   Pulmonary/Chest: Effort normal.  Abdominal: She exhibits no distension.  Musculoskeletal: Normal range of motion.  Lymphadenopathy:    She has no cervical adenopathy.  Neurological: She is alert and oriented to person, place, and time.  Skin: Skin is warm and dry. No erythema.  Psychiatric: She has a normal mood and affect.    ED Course  Procedures (including critical care time) Labs Review Labs Reviewed - No data to display Imaging Review No results found.  EKG Interpretation   None       MDM   1. Pain, dental    Probable early dental infection.  She was prescribed hydrocodone, amoxil.  Dental resources given.  The patient appears reasonably screened and/or stabilized for discharge and I doubt any other medical condition or other [facility] requiring further screening, evaluation, or treatment in the ED at this time prior to discharge.     Burgess Amor, PA-C 01/25/13 1829

## 2013-01-31 ENCOUNTER — Ambulatory Visit: Payer: Self-pay

## 2013-02-10 ENCOUNTER — Other Ambulatory Visit: Payer: Self-pay

## 2013-02-16 ENCOUNTER — Encounter (HOSPITAL_COMMUNITY): Payer: Self-pay | Admitting: Emergency Medicine

## 2013-02-16 ENCOUNTER — Emergency Department (HOSPITAL_COMMUNITY)
Admission: EM | Admit: 2013-02-16 | Discharge: 2013-02-16 | Disposition: A | Discharge status: Home / Self Care | Payer: No Typology Code available for payment source | Attending: Emergency Medicine | Admitting: Emergency Medicine

## 2013-02-16 DIAGNOSIS — S239XXA Sprain of unspecified parts of thorax, initial encounter: Secondary | ICD-10-CM | POA: Insufficient documentation

## 2013-02-16 DIAGNOSIS — Y939 Activity, unspecified: Secondary | ICD-10-CM | POA: Insufficient documentation

## 2013-02-16 DIAGNOSIS — G8929 Other chronic pain: Secondary | ICD-10-CM | POA: Insufficient documentation

## 2013-02-16 DIAGNOSIS — J45909 Unspecified asthma, uncomplicated: Secondary | ICD-10-CM | POA: Insufficient documentation

## 2013-02-16 DIAGNOSIS — S29012A Strain of muscle and tendon of back wall of thorax, initial encounter: Secondary | ICD-10-CM

## 2013-02-16 DIAGNOSIS — F17200 Nicotine dependence, unspecified, uncomplicated: Secondary | ICD-10-CM | POA: Insufficient documentation

## 2013-02-16 DIAGNOSIS — E7800 Pure hypercholesterolemia, unspecified: Secondary | ICD-10-CM | POA: Insufficient documentation

## 2013-02-16 DIAGNOSIS — E079 Disorder of thyroid, unspecified: Secondary | ICD-10-CM | POA: Insufficient documentation

## 2013-02-16 DIAGNOSIS — Z8719 Personal history of other diseases of the digestive system: Secondary | ICD-10-CM | POA: Insufficient documentation

## 2013-02-16 DIAGNOSIS — Y929 Unspecified place or not applicable: Secondary | ICD-10-CM | POA: Insufficient documentation

## 2013-02-16 DIAGNOSIS — X58XXXA Exposure to other specified factors, initial encounter: Secondary | ICD-10-CM | POA: Insufficient documentation

## 2013-02-16 DIAGNOSIS — Z79899 Other long term (current) drug therapy: Secondary | ICD-10-CM | POA: Insufficient documentation

## 2013-02-16 MED ORDER — KETOROLAC TROMETHAMINE 60 MG/2ML IM SOLN
60.0000 mg | Freq: Once | INTRAMUSCULAR | Status: AC
  Administered 2013-02-16: 60 mg via INTRAMUSCULAR
  Filled 2013-02-16: qty 2

## 2013-02-16 MED ORDER — METHOCARBAMOL 500 MG PO TABS: 500.0000 mg | ORAL_TABLET | Freq: Two times a day (BID) | ORAL | Status: DC | PRN

## 2013-02-16 MED ORDER — NAPROXEN 500 MG PO TABS: 500.0000 mg | ORAL_TABLET | Freq: Two times a day (BID) | ORAL | Status: DC

## 2013-02-16 NOTE — ED Notes (Signed)
Pt reports upper back pain that started yesterday.

## 2013-02-16 NOTE — ED Notes (Signed)
Pt alert & oriented x4, stable gait. Patient given discharge instructions, paperwork & prescription(s). Patient  instructed to stop at the registration desk to finish any additional paperwork. Patient verbalized understanding. Pt left department w/ no further questions. 

## 2013-02-16 NOTE — ED Notes (Signed)
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 States pain is not like her normal back pain.

## 2013-02-16 NOTE — ED Provider Notes (Signed)
CSN: [id number]     Arrival date & time 02/16/13  0607 History   First MD Initiated Contact with Patient 02/16/13 [phone]     Chief Complaint  Patient presents with  . Back Pain   (Consider location/radiation/quality/duration/timing/severity/associated sxs/prior Treatment) HPI Comments: 37 year old female with a history of chronic back pain and sciatica on the right side who presents with a complaint of upper back pain. This started yesterday morning, has been persistent since that time and seems to be worse with movement, standing and lying down. She is relatively comfortable when she sits on the edge of the bed. The symptoms started after she went back to work for a [employer] and has been doing some relatively heavy lifting. She denies numbness or weakness that is active at this time, fevers chills nausea vomiting or diarrhea. She states that this pain is distinctly different than her sciatica pain in the past. She has had ibuprofen prior to arrival with no improvement  Patient is a 37 y.o. female presenting with back pain. The history is provided by the patient and a relative.  Back Pain   Past Medical History  Diagnosis Date  . Asthma   . Hypercholesteremia   . Thyroid disease   . Diverticulosis    Past Surgical History  Procedure Laterality Date  . Cyst excision    . Leep    . Tubal ligation    . Appendectomy     Family History  Problem Relation Age of Onset  . Hypertension Mother   . Thyroid disease Mother   . Glaucoma Mother    History  Substance Use Topics  . Smoking status: Current Every Day Smoker -- 0.50 packs/day    Types: Cigarettes  . Smokeless tobacco: Not on file  . Alcohol Use: No   OB History   Grav Para Term Preterm Abortions TAB SAB Ect Mult Living                 Review of Systems  Musculoskeletal: Positive for back pain.  All other systems reviewed and are negative.    Allergies  Aspirin and Tramadol  Home Medications   Current  Outpatient Rx  Name  Route  Sig  Dispense  Refill  . albuterol (PROVENTIL) 90 MCG/ACT inhaler   Inhalation   Inhale 1 puff into the lungs every 4 (four) hours as needed for wheezing or shortness of breath.          Marland Kitchen atorvastatin (LIPITOR) 20 MG tablet   Oral   Take 20 mg by mouth every morning.         . cetirizine (ZYRTEC ALLERGY) 10 MG tablet   Oral   Take 10 mg by mouth daily.           . cyclobenzaprine (FLEXERIL) 10 MG tablet   Oral   Take 10 mg by mouth 2 (two) times daily as needed for muscle spasms.         Marland Kitchen dicyclomine (BENTYL) 20 MG tablet   Oral   Take 20 mg by mouth 4 (four) times daily.          . fish oil-omega-3 fatty acids 1000 MG capsule   Oral   Take 1 g by mouth daily.         . fluticasone (FLOVENT HFA) 110 MCG/ACT inhaler   Inhalation   Inhale 1 puff into the lungs 2 (two) times daily.         Marland Kitchen glucosamine-chondroitin 500-400 MG  tablet   Oral   Take 1 tablet by mouth daily.         Marland Kitchen ibuprofen (ADVIL,MOTRIN) 800 MG tablet   Oral   Take 800 mg by mouth every 8 (eight) hours as needed.         Marland Kitchen levothyroxine (SYNTHROID, LEVOTHROID) 112 MCG tablet   Oral   Take 112 mcg by mouth daily.           . ranitidine (ZANTAC) 150 MG tablet   Oral   Take 150 mg by mouth 2 (two) times daily.         Marland Kitchen Spacer/Aero-Holding Chambers (BREATHERITE COLL SPACER ADULT) MISC      Use spacer with inhaler at all times.  This helps to get the medicine in your lungs          . amoxicillin (AMOXIL) 500 MG capsule   Oral   Take 1 capsule (500 mg total) by mouth 3 (three) times daily.   30 capsule   0   . HYDROcodone-acetaminophen (NORCO/VICODIN) 5-325 MG per tablet   Oral   Take 1 tablet by mouth every 4 (four) hours as needed for pain.   15 tablet   0   . methocarbamol (ROBAXIN) 500 MG tablet   Oral   Take 1 tablet (500 mg total) by mouth 2 (two) times daily as needed for muscle spasms.   20 tablet   0   . naproxen (NAPROSYN) 500  MG tablet   Oral   Take 1 tablet (500 mg total) by mouth 2 (two) times daily with a meal.   30 tablet   0    BP 107/63  Pulse 84  Temp(Src) 97.9 F (36.6 C) (Oral)  Resp 20  Ht 5\' 3"  (1.6 m)  Wt 195 lb (88.451 kg)  BMI 34.55 kg/m2  SpO2 100%  LMP 01/06/2013 Physical Exam  Nursing note and vitals reviewed. Constitutional: She appears well-developed and well-nourished. No distress.  HENT:  Head: Normocephalic and atraumatic.  Mouth/Throat: Oropharynx is clear and moist. No oropharyngeal exudate.  Eyes: Conjunctivae and EOM are normal. Pupils are equal, round, and reactive to light. Right eye exhibits no discharge. Left eye exhibits no discharge. No scleral icterus.  Neck: Normal range of motion. Neck supple. No JVD present. No thyromegaly present.  Cardiovascular: Normal rate, regular rhythm, normal heart sounds and intact distal pulses.  Exam reveals no gallop and no friction rub.   No murmur heard. Pulmonary/Chest: Effort normal and breath sounds normal. No respiratory distress. She has no wheezes. She has no rales.  Musculoskeletal: Normal range of motion. She exhibits tenderness ( Reproducible tenderness to palpation in the bilateral rhomboid muscles). She exhibits no edema.  Lymphadenopathy:    She has no cervical adenopathy.  Neurological: She is alert. Coordination normal.  Normal strength and sensation of all 4 extremities, cranial nerves 2-12 intact  Skin: Skin is warm and dry.  Psychiatric: She has a normal mood and affect. Her behavior is normal.    ED Course  Procedures (including critical care time) Labs Review Labs Reviewed - No data to display Imaging Review No results found.  EKG Interpretation   None       MDM   1. Strain of thoracic paraspinal muscles excluding T1 and T2 levels, initial encounter    The patient has no spinal tenderness, the pain is all paraspinal and located in the mid thoracic region around the rhomboid muscles. This is all very  reproducible, she  denies red flags for pathologic bacteria in an otherwise appears comfortable. Medications as below.   Meds given in ED:  Medications  ketorolac (TORADOL) injection 60 mg (not administered)    New Prescriptions   METHOCARBAMOL (ROBAXIN) 500 MG TABLET    Take 1 tablet (500 mg total) by mouth 2 (two) times daily as needed for muscle spasms.   NAPROXEN (NAPROSYN) 500 MG TABLET    Take 1 tablet (500 mg total) by mouth 2 (two) times daily with a meal.        Vida Roller, MD 02/16/13 [phone]

## 2013-03-21 ENCOUNTER — Encounter (HOSPITAL_COMMUNITY): Payer: Self-pay | Admitting: Emergency Medicine

## 2013-03-21 ENCOUNTER — Emergency Department (HOSPITAL_COMMUNITY)
Admission: EM | Admit: 2013-03-21 | Discharge: 2013-03-22 | Disposition: A | Discharge status: Home / Self Care | Payer: No Typology Code available for payment source | Attending: Emergency Medicine | Admitting: Emergency Medicine

## 2013-03-21 DIAGNOSIS — J039 Acute tonsillitis, unspecified: Secondary | ICD-10-CM

## 2013-03-21 DIAGNOSIS — H6692 Otitis media, unspecified, left ear: Secondary | ICD-10-CM

## 2013-03-21 MED ORDER — AMOXICILLIN 500 MG PO CAPS: 500.0000 mg | ORAL_CAPSULE | Freq: Three times a day (TID) | ORAL | Status: DC

## 2013-03-21 MED ORDER — MAGIC MOUTHWASH W/LIDOCAINE: 5.0000 mL | Freq: Three times a day (TID) | ORAL | Status: DC

## 2013-03-21 MED ORDER — HYDROCODONE-ACETAMINOPHEN 7.5-325 MG/15ML PO SOLN
10.0000 mL | Freq: Once | ORAL | Status: AC
  Administered 2013-03-22: 10 mL via ORAL
  Filled 2013-03-21: qty 15

## 2013-03-21 MED ORDER — AMOXICILLIN 250 MG PO CAPS
500.0000 mg | ORAL_CAPSULE | Freq: Once | ORAL | Status: AC
  Administered 2013-03-22: 500 mg via ORAL
  Filled 2013-03-21: qty 2

## 2013-03-21 MED ORDER — ANTIPYRINE-BENZOCAINE 5.4-1.4 % OT SOLN
3.0000 [drp] | Freq: Once | OTIC | Status: AC
  Administered 2013-03-22: 3 [drp] via OTIC
  Filled 2013-03-21: qty 10

## 2013-03-21 NOTE — ED Notes (Signed)
Pt with left earache and sore throat left side since yesterday, denies fever or N/V

## 2013-03-22 NOTE — ED Provider Notes (Signed)
CSN: [id number]     Arrival date & time 03/21/13  2228 History   First MD Initiated Contact with Patient 03/21/13 2315     Chief Complaint  Patient presents with  . Sore Throat  . Otalgia   (Consider location/radiation/quality/duration/timing/severity/associated sxs/prior Treatment) Patient is a 37 y.o. female presenting with pharyngitis and ear pain. The history is provided by the patient.  Sore Throat This is a new problem. The current episode started yesterday. The problem occurs constantly. The problem has been unchanged. Associated symptoms include a sore throat and swollen glands. Pertinent negatives include no abdominal pain, anorexia, arthralgias, chest pain, chills, congestion, coughing, fever, headaches, myalgias, nausea, neck pain, numbness, rash, vertigo, visual change, vomiting or weakness. Associated symptoms comments: Left ear pain. The symptoms are aggravated by swallowing. She has tried nothing for the symptoms. The treatment provided no relief.  Otalgia Location:  Left Behind ear:  No abnormality Quality:  Aching and pressure Severity:  Moderate Onset quality:  Gradual Duration:  1 day Timing:  Constant Progression:  Unchanged Chronicity:  New Context: not direct blow, not foreign body in ear and no water in ear   Relieved by:  Nothing Worsened by:  Nothing tried Ineffective treatments:  None tried Associated symptoms: sore throat   Associated symptoms: no abdominal pain, no congestion, no cough, no ear discharge, no fever, no headaches, no hearing loss, no neck pain, no rash, no rhinorrhea, no tinnitus and no vomiting     Past Medical History  Diagnosis Date  . Asthma   . Hypercholesteremia   . Thyroid disease   . Diverticulosis    Past Surgical History  Procedure Laterality Date  . Cyst excision    . Leep    . Tubal ligation    . Appendectomy     Family History  Problem Relation Age of Onset  . Hypertension Mother   . Thyroid disease Mother   .  Glaucoma Mother    History  Substance Use Topics  . Smoking status: Current Every Day Smoker -- 0.50 packs/day    Types: Cigarettes  . Smokeless tobacco: Not on file  . Alcohol Use: No   OB History   Grav Para Term Preterm Abortions TAB SAB Ect Mult Living                 Review of Systems  Constitutional: Negative for fever, chills, activity change and appetite change.  HENT: Positive for ear pain and sore throat. Negative for congestion, ear discharge, facial swelling, hearing loss, rhinorrhea, tinnitus and trouble swallowing.   Eyes: Negative for visual disturbance.  Respiratory: Negative for cough, shortness of breath, wheezing and stridor.   Cardiovascular: Negative for chest pain.  Gastrointestinal: Negative for nausea, vomiting, abdominal pain and anorexia.  Musculoskeletal: Negative for arthralgias, myalgias, neck pain and neck stiffness.  Skin: Negative.  Negative for rash.  Neurological: Negative for dizziness, vertigo, weakness, numbness and headaches.  Hematological: Negative for adenopathy.  Psychiatric/Behavioral: Negative for confusion.  All other systems reviewed and are negative.    Allergies  Aspirin and Tramadol  Home Medications   Current Outpatient Rx  Name  Route  Sig  Dispense  Refill  . albuterol (PROVENTIL) 90 MCG/ACT inhaler   Inhalation   Inhale 1 puff into the lungs every 4 (four) hours as needed for wheezing or shortness of breath.          . Alum & Mag Hydroxide-Simeth (MAGIC MOUTHWASH W/LIDOCAINE) SOLN   Oral   Take  5 mLs by mouth 3 (three) times daily. swish and spit, do not swallow   100 mL   0   . amoxicillin (AMOXIL) 500 MG capsule   Oral   Take 1 capsule (500 mg total) by mouth 3 (three) times daily.   30 capsule   0   . amoxicillin (AMOXIL) 500 MG capsule   Oral   Take 1 capsule (500 mg total) by mouth 3 (three) times daily. For 10 days   30 capsule   0   . atorvastatin (LIPITOR) 20 MG tablet   Oral   Take 20 mg by  mouth every morning.         . cetirizine (ZYRTEC ALLERGY) 10 MG tablet   Oral   Take 10 mg by mouth daily.           . cyclobenzaprine (FLEXERIL) 10 MG tablet   Oral   Take 10 mg by mouth 2 (two) times daily as needed for muscle spasms.         Marland Kitchen dicyclomine (BENTYL) 20 MG tablet   Oral   Take 20 mg by mouth 4 (four) times daily.          . fish oil-omega-3 fatty acids 1000 MG capsule   Oral   Take 1 g by mouth daily.         . fluticasone (FLOVENT HFA) 110 MCG/ACT inhaler   Inhalation   Inhale 1 puff into the lungs 2 (two) times daily.         Marland Kitchen glucosamine-chondroitin 500-400 MG tablet   Oral   Take 1 tablet by mouth daily.         Marland Kitchen HYDROcodone-acetaminophen (NORCO/VICODIN) 5-325 MG per tablet   Oral   Take 1 tablet by mouth every 4 (four) hours as needed for pain.   15 tablet   0   . ibuprofen (ADVIL,MOTRIN) 800 MG tablet   Oral   Take 800 mg by mouth every 8 (eight) hours as needed.         Marland Kitchen levothyroxine (SYNTHROID, LEVOTHROID) 112 MCG tablet   Oral   Take 112 mcg by mouth daily.           . methocarbamol (ROBAXIN) 500 MG tablet   Oral   Take 1 tablet (500 mg total) by mouth 2 (two) times daily as needed for muscle spasms.   20 tablet   0   . naproxen (NAPROSYN) 500 MG tablet   Oral   Take 1 tablet (500 mg total) by mouth 2 (two) times daily with a meal.   30 tablet   0   . ranitidine (ZANTAC) 150 MG tablet   Oral   Take 150 mg by mouth 2 (two) times daily.         Marland Kitchen Spacer/Aero-Holding Chambers (BREATHERITE COLL SPACER ADULT) MISC      Use spacer with inhaler at all times.  This helps to get the medicine in your lungs           BP 113/44  Pulse 77  Temp(Src) 97.7 F (36.5 C) (Oral)  Resp 18  Ht 5\' 3"  (1.6 m)  Wt 190 lb (86.183 kg)  BMI 33.67 kg/m2  SpO2 98%  LMP 02/24/2013 Physical Exam  Nursing note and vitals reviewed. Constitutional: She is oriented to person, place, and time. She appears well-developed and  well-nourished. No distress.  HENT:  Head: Normocephalic and atraumatic.  Right Ear: Tympanic membrane and ear canal normal. No mastoid  tenderness. No hemotympanum.  Left Ear: Ear canal normal. No mastoid tenderness. Tympanic membrane is erythematous. Tympanic membrane is not bulging. A middle ear effusion is present. No hemotympanum.  Mouth/Throat: Uvula is midline and mucous membranes are normal. No trismus in the jaw. No uvula swelling. Posterior oropharyngeal edema and posterior oropharyngeal erythema present. No oropharyngeal exudate or tonsillar abscesses.  Eyes: Conjunctivae and EOM are normal. Pupils are equal, round, and reactive to light.  Neck: Normal range of motion. Neck supple.  Cardiovascular: Normal rate, regular rhythm, normal heart sounds and intact distal pulses.   No murmur heard. Pulmonary/Chest: Effort normal and breath sounds normal. No respiratory distress. She has no wheezes. She has no rales. She exhibits no tenderness.  Abdominal: There is no splenomegaly. There is no tenderness.  Musculoskeletal: Normal range of motion.  Lymphadenopathy:    She has cervical adenopathy.       Right cervical: Superficial cervical adenopathy present.       Left cervical: Superficial cervical adenopathy present.  Neurological: She is alert and oriented to person, place, and time. She exhibits normal muscle tone. Coordination normal.  Skin: Skin is warm and dry.    ED Course  Procedures (including critical care time) Labs Review Labs Reviewed - No data to display Imaging Review No results found.  EKG Interpretation   None       MDM   1. Otitis media, left   2. Tonsillitis     Pt agrees to magic mouthwash, amoxil and tylenol for fever and pain.  Pt appears stabe for discharge.  Dontavis Tschantz L. Trisha Mangle, PA-C 03/22/13 2215

## 2013-03-22 NOTE — ED Notes (Signed)
See PA assessment 

## 2013-03-23 NOTE — ED Provider Notes (Signed)
Medical screening examination/treatment/procedure(s) were performed by non-physician practitioner and as supervising physician I was immediately available for consultation/collaboration.  EKG Interpretation   None         Presly Steinruck W. Reedy Biernat, MD 03/23/13 0053 

## 2013-04-29 ENCOUNTER — Emergency Department (HOSPITAL_COMMUNITY)
Admission: EM | Admit: 2013-04-29 | Discharge: 2013-04-29 | Disposition: A | Discharge status: Home / Self Care | Payer: Self-pay | Attending: Emergency Medicine | Admitting: Emergency Medicine

## 2013-04-29 ENCOUNTER — Emergency Department (HOSPITAL_COMMUNITY): Payer: Self-pay

## 2013-04-29 ENCOUNTER — Encounter (HOSPITAL_COMMUNITY): Payer: Self-pay | Admitting: Emergency Medicine

## 2013-04-29 DIAGNOSIS — E079 Disorder of thyroid, unspecified: Secondary | ICD-10-CM | POA: Insufficient documentation

## 2013-04-29 DIAGNOSIS — Z79899 Other long term (current) drug therapy: Secondary | ICD-10-CM | POA: Insufficient documentation

## 2013-04-29 DIAGNOSIS — J45909 Unspecified asthma, uncomplicated: Secondary | ICD-10-CM | POA: Insufficient documentation

## 2013-04-29 DIAGNOSIS — Z8719 Personal history of other diseases of the digestive system: Secondary | ICD-10-CM | POA: Insufficient documentation

## 2013-04-29 DIAGNOSIS — IMO0002 Reserved for concepts with insufficient information to code with codable children: Secondary | ICD-10-CM | POA: Insufficient documentation

## 2013-04-29 DIAGNOSIS — S79919A Unspecified injury of unspecified hip, initial encounter: Secondary | ICD-10-CM | POA: Insufficient documentation

## 2013-04-29 DIAGNOSIS — F17200 Nicotine dependence, unspecified, uncomplicated: Secondary | ICD-10-CM | POA: Insufficient documentation

## 2013-04-29 DIAGNOSIS — M25559 Pain in unspecified hip: Secondary | ICD-10-CM

## 2013-04-29 DIAGNOSIS — W19XXXA Unspecified fall, initial encounter: Secondary | ICD-10-CM

## 2013-04-29 DIAGNOSIS — S79929A Unspecified injury of unspecified thigh, initial encounter: Principal | ICD-10-CM

## 2013-04-29 DIAGNOSIS — E7800 Pure hypercholesterolemia, unspecified: Secondary | ICD-10-CM | POA: Insufficient documentation

## 2013-04-29 DIAGNOSIS — Y9389 Activity, other specified: Secondary | ICD-10-CM | POA: Insufficient documentation

## 2013-04-29 DIAGNOSIS — Z791 Long term (current) use of non-steroidal anti-inflammatories (NSAID): Secondary | ICD-10-CM | POA: Insufficient documentation

## 2013-04-29 DIAGNOSIS — Y929 Unspecified place or not applicable: Secondary | ICD-10-CM | POA: Insufficient documentation

## 2013-04-29 DIAGNOSIS — W108XXA Fall (on) (from) other stairs and steps, initial encounter: Secondary | ICD-10-CM | POA: Insufficient documentation

## 2013-04-29 MED ORDER — METHOCARBAMOL 500 MG PO TABS: 1000.0000 mg | ORAL_TABLET | Freq: Two times a day (BID) | ORAL | Status: DC

## 2013-04-29 MED ORDER — HYDROCODONE-ACETAMINOPHEN 5-325 MG PO TABS: 1.0000 | ORAL_TABLET | ORAL | Status: DC | PRN

## 2013-04-29 MED ORDER — CYCLOBENZAPRINE HCL 10 MG PO TABS: 10.0000 mg | ORAL_TABLET | Freq: Two times a day (BID) | ORAL | Status: DC | PRN

## 2013-04-29 MED ORDER — HYDROCODONE-ACETAMINOPHEN 5-325 MG PO TABS
2.0000 | ORAL_TABLET | Freq: Once | ORAL | Status: AC
  Administered 2013-04-29: 2 via ORAL
  Filled 2013-04-29: qty 2

## 2013-04-29 NOTE — Progress Notes (Signed)
[facility] CL provided pt with a list of primary care resources, [facility] information and a [facility] [employer] [facility] application. Patient stated that she lived in [place] but lived in [place] [facility] about getting patient resources in [place]

## 2013-04-29 NOTE — ED Provider Notes (Signed)
Medical screening examination/treatment/procedure(s) were performed by non-physician practitioner and as supervising physician I was immediately available for consultation/collaboration.  EKG Interpretation   None        Elly Haffey M Lon Klippel, MD 04/29/13 1603 

## 2013-04-29 NOTE — ED Provider Notes (Signed)
CSN: [id number]     Arrival date & time 04/29/13  1017 History  This chart was scribed for non-physician practitioner, Arthor Captain, PA-C working with Enid Skeens, MD by Greggory Stallion, ED scribe. This patient was seen in room WTR9/WTR9 and the patient's care was started at 12:50 PM.     Chief Complaint  Patient presents with  . Hip Pain   The history is provided by the patient. No language interpreter was used.   HPI Comments: Rachel Coleman is a 38 y.o. female who presents to the Emergency Department complaining of a fall that occurred 3 days ago. Pt slipped and fell down a few steps. She has gradual onset, constant left hip pain and spasms in her left buttock. Bearing weight worsens the pain.   Past Medical History  Diagnosis Date  . Asthma   . Hypercholesteremia   . Thyroid disease   . Diverticulosis    Past Surgical History  Procedure Laterality Date  . Cyst excision    . Leep    . Tubal ligation    . Appendectomy     Family History  Problem Relation Age of Onset  . Hypertension Mother   . Thyroid disease Mother   . Glaucoma Mother    History  Substance Use Topics  . Smoking status: Current Every Day Smoker -- 0.50 packs/day    Types: Cigarettes  . Smokeless tobacco: Not on file  . Alcohol Use: No   OB History   Grav Para Term Preterm Abortions TAB SAB Ect Mult Living                 Review of Systems  Constitutional: Negative for fever.  HENT: Negative for congestion.   Eyes: Negative for redness.  Respiratory: Negative for cough.   Gastrointestinal: Negative for abdominal pain.  Musculoskeletal: Positive for arthralgias and myalgias.  Skin: Negative for rash.  Psychiatric/Behavioral: Negative for confusion.    Allergies  Aspirin; Ibuprofen; and Tramadol  Home Medications   Current Outpatient Rx  Name  Route  Sig  Dispense  Refill  . albuterol (PROVENTIL) 90 MCG/ACT inhaler   Inhalation   Inhale 2 puffs into the lungs 3 (three) times  daily.          . Alum & Mag Hydroxide-Simeth (MAGIC MOUTHWASH W/LIDOCAINE) SOLN   Oral   Take 5 mLs by mouth 3 (three) times daily. swish and spit, do not swallow   100 mL   0   . atorvastatin (LIPITOR) 20 MG tablet   Oral   Take 20 mg by mouth every morning.         . cetirizine (ZYRTEC ALLERGY) 10 MG tablet   Oral   Take 10 mg by mouth daily.           Marland Kitchen dicyclomine (BENTYL) 20 MG tablet   Oral   Take 20 mg by mouth 3 (three) times daily as needed.          . diphenhydrAMINE (BENADRYL) 25 mg capsule   Oral   Take 25 mg by mouth every 4 (four) hours as needed for itching.         . fish oil-omega-3 fatty acids 1000 MG capsule   Oral   Take 1 g by mouth daily.         . fluticasone (FLOVENT HFA) 110 MCG/ACT inhaler   Inhalation   Inhale 2 puffs into the lungs 2 (two) times daily as needed.          Marland Kitchen  glucosamine-chondroitin 500-400 MG tablet   Oral   Take 1 tablet by mouth daily.         Marland Kitchen. ibuprofen (ADVIL,MOTRIN) 200 MG tablet   Oral   Take 800 mg by mouth every 4 (four) hours as needed for moderate pain.         Marland Kitchen. levothyroxine (SYNTHROID, LEVOTHROID) 125 MCG tablet   Oral   Take 125 mcg by mouth daily before breakfast.         . naproxen (NAPROSYN) 500 MG tablet   Oral   Take 1 tablet (500 mg total) by mouth 2 (two) times daily with a meal.   30 tablet   0   . PRESCRIPTION MEDICATION   Both Ears   Place into both ears 3 (three) times daily as needed. [facility] - use as needed for pain         . ranitidine (ZANTAC) 150 MG tablet   Oral   Take 150 mg by mouth 2 (two) times daily.         Marland Kitchen. Spacer/Aero-Holding Chambers (BREATHERITE COLL SPACER ADULT) MISC      Use spacer with inhaler at all times.  This helps to get the medicine in your lungs           BP 113/95  Pulse 85  Temp(Src) 98.1 F (36.7 C) (Oral)  SpO2 97%  LMP 04/19/2013  Physical Exam  Nursing note and vitals  reviewed. Constitutional: She is oriented to person, place, and time. She appears well-developed and well-nourished. No distress.  HENT:  Head: Normocephalic and atraumatic.  Eyes: EOM are normal.  Neck: Neck supple. No tracheal deviation present.  Cardiovascular: Normal rate.   Pulmonary/Chest: Effort normal. No respiratory distress.  Musculoskeletal: Normal range of motion.  Full ROM of left hip. Pain with ROM. No clicking, popping or crepitus in the hip. No external bruising.   Neurological: She is alert and oriented to person, place, and time.  Skin: Skin is warm and dry.  Psychiatric: She has a normal mood and affect. Her behavior is normal.    ED Course  Procedures (including critical care time)  DIAGNOSTIC STUDIES: Oxygen Saturation is 97% on RA, normal by my interpretation.    COORDINATION OF CARE: 12:53 PM-Discussed treatment plan which includes pain medication, a muscle relaxer, stretching and ice with pt at bedside and pt agreed to plan. Advised pt to follow up with a PCP.   Labs Review Labs Reviewed - No data to display Imaging Review Dg Hip Complete Left  04/29/2013   CLINICAL DATA:  Pain post trauma  EXAM: LEFT HIP - COMPLETE 2+ VIEW  COMPARISON:  None  FINDINGS: Frontal pelvis as well as frontal and lateral left hip images were obtained. There is no fracture or dislocation. Joint spaces appear intact. No erosive change.  IMPRESSION: No abnormality noted.   Electronically Signed   By: Bretta BangWilliam  Woodruff M.D.   On: 04/29/2013 12:22    EKG Interpretation   None       MDM   1. Hip pain   2. Fall    Patient X-Ray negative for obvious fracture or dislocation. Pain managed in ED. Pt advised to follow up with orthopedics if symptoms persist for possibility of missed fracture diagnosis.  conservative therapy recommended and discussed. Patient will be dc home & is agreeable with above plan.   I personally performed the services described in this documentation, which  was scribed in my  presence. The recorded information has been reviewed and is accurate.    Arthor Captain, PA-C 04/29/13 1319

## 2013-04-29 NOTE — Progress Notes (Signed)
   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 last 6 months [facility] [place] "can come to either or"     Action/Plan:   CM consulted by [facility] staff, stacy CM spoke with pt and provided her a list of [place] providers Cm also spoke pt about 6 ED visits   Action/Plan Detail:   Anticipated DC Date:  04/29/2013     Status Recommendation to Physician:   Result of Recommendation:    Other ED Services  Consult Working Plan    DC Planning Services  Other  PCP issues  Outpatient Services - Pt will follow up    Choice offered to / List presented to:            Status of service:  Completed, signed off  ED Comments:   ED Comments Detail:  [facility] [place] [facility] [place] [address] [place] [address] [phone] Website  Accepts: Uninsured, Underinsured Income: Low Income Fees: Free or low cost Languages Spoken: English  Services: Presenter, broadcastingWomen's Health Services, Primary Care, Geographical information systems officerharmacy Services, OB/GYN, Advice workerMedical Services, Development worker, communityDental Services, Application Assistance - Rx

## 2013-04-29 NOTE — ED Notes (Signed)
Patient states she fell 3 days ago. Patient c/o pain in left hip.

## 2013-04-29 NOTE — Discharge Instructions (Signed)
Hip Pain  The hips join the upper legs to the lower pelvis. The bones, cartilage, tendons, and muscles of the hip joint perform a lot of work each day holding your body weight and allowing you to move around.  Hip pain is a common symptom. It can range from a minor ache to severe pain on 1 or both hips. Pain may be felt on the inside of the hip joint near the groin, or the outside near the buttocks and upper thigh. There may be swelling or stiffness as well. It occurs more often when a person walks or performs activity. There are many reasons hip pain can develop.  CAUSES   It is important to work with your caregiver to identify the cause since many conditions can impact the bones, cartilage, muscles, and tendons of the hips. Causes for hip pain include:   Broken (fractured) bones.   Separation of the thighbone from the hip socket (dislocation).   Torn cartilage of the hip joint.   Swelling (inflammation) of a tendon (tendonitis), the sac within the hip joint (bursitis), or a joint.   A weakening in the abdominal wall (hernia), affecting the nerves to the hip.   Arthritis in the hip joint or lining of the hip joint.   Pinched nerves in the back, hip, or upper thigh.   A bulging disc in the spine (herniated disc).   Rarely, bone infection or cancer.  DIAGNOSIS   The location of your hip pain will help your caregiver understand what may be causing the pain. A diagnosis is based on your medical history, your symptoms, results from your physical exam, and results from diagnostic tests. Diagnostic tests may include X-ray exams, a computerized magnetic scan (magnetic resonance imaging, MRI), or bone scan.  TREATMENT   Treatment will depend on the cause of your hip pain. Treatment may include:   Limiting activities and resting until symptoms improve.   Crutches or other walking supports (a cane or brace).   Ice, elevation, and compression.   Physical therapy or home exercises.   Shoe inserts or special  shoes.   Losing weight.   Medications to reduce pain.   Undergoing surgery.  HOME CARE INSTRUCTIONS    Only take over-the-counter or prescription medicines for pain, discomfort, or fever as directed by your caregiver.   Put ice on the injured area:   Put ice in a plastic bag.   Place a towel between your skin and the bag.   Leave the ice on for 15-20 minutes at a time, 03-04 times a day.   Keep your leg raised (elevated) when possible to lessen swelling.   Avoid activities that cause pain.   Follow specific exercises as directed by your caregiver.   Sleep with a pillow between your legs on your most comfortable side.   Record how often you have hip pain, the location of the pain, and what it feels like. This information may be helpful to you and your caregiver.   Ask your caregiver about returning to work or sports and whether you should drive.   Follow up with your caregiver for further exams, therapy, or testing as directed.  SEEK MEDICAL CARE IF:    Your pain or swelling continues or worsens after 1 week.   You are feeling unwell or have chills.   You have increasing difficulty with walking.   You have a loss of sensation or other new symptoms.   You have questions or concerns.  SEEK   IMMEDIATE MEDICAL CARE IF:    You cannot put weight on the affected hip.   You have fallen.   You have a sudden increase in pain and swelling in your hip.   You have a fever.  MAKE SURE YOU:    Understand these instructions.   Will watch your condition.   Will get help right away if you are not doing well or get worse.  Document Released: 09/11/2009 Document Revised: 06/16/2011 Document Reviewed: 09/11/2009  [employer] Patient Information 2014 [employer]

## 2013-07-19 ENCOUNTER — Emergency Department (HOSPITAL_COMMUNITY)
Admission: EM | Admit: 2013-07-19 | Discharge: 2013-07-19 | Disposition: A | Discharge status: Home / Self Care | Payer: No Typology Code available for payment source | Attending: Emergency Medicine | Admitting: Emergency Medicine

## 2013-07-19 ENCOUNTER — Encounter (HOSPITAL_COMMUNITY): Payer: Self-pay | Admitting: Emergency Medicine

## 2013-07-19 DIAGNOSIS — J45909 Unspecified asthma, uncomplicated: Secondary | ICD-10-CM | POA: Insufficient documentation

## 2013-07-19 DIAGNOSIS — Z8719 Personal history of other diseases of the digestive system: Secondary | ICD-10-CM | POA: Insufficient documentation

## 2013-07-19 DIAGNOSIS — Z79899 Other long term (current) drug therapy: Secondary | ICD-10-CM | POA: Insufficient documentation

## 2013-07-19 DIAGNOSIS — K089 Disorder of teeth and supporting structures, unspecified: Secondary | ICD-10-CM | POA: Insufficient documentation

## 2013-07-19 DIAGNOSIS — F17200 Nicotine dependence, unspecified, uncomplicated: Secondary | ICD-10-CM | POA: Insufficient documentation

## 2013-07-19 DIAGNOSIS — IMO0002 Reserved for concepts with insufficient information to code with codable children: Secondary | ICD-10-CM | POA: Insufficient documentation

## 2013-07-19 DIAGNOSIS — E7800 Pure hypercholesterolemia, unspecified: Secondary | ICD-10-CM | POA: Insufficient documentation

## 2013-07-19 DIAGNOSIS — Z792 Long term (current) use of antibiotics: Secondary | ICD-10-CM | POA: Insufficient documentation

## 2013-07-19 DIAGNOSIS — E079 Disorder of thyroid, unspecified: Secondary | ICD-10-CM | POA: Insufficient documentation

## 2013-07-19 DIAGNOSIS — K0889 Other specified disorders of teeth and supporting structures: Secondary | ICD-10-CM

## 2013-07-19 MED ORDER — PENICILLIN V POTASSIUM 500 MG PO TABS: 500.0000 mg | ORAL_TABLET | Freq: Four times a day (QID) | ORAL | Status: DC

## 2013-07-19 MED ORDER — OXYCODONE-ACETAMINOPHEN 5-325 MG PO TABS
2.0000 | ORAL_TABLET | Freq: Once | ORAL | Status: AC
  Administered 2013-07-19: 2 via ORAL
  Filled 2013-07-19: qty 2

## 2013-07-19 MED ORDER — NAPROXEN 500 MG PO TABS: 500.0000 mg | ORAL_TABLET | Freq: Two times a day (BID) | ORAL | Status: DC

## 2013-07-19 MED ORDER — OXYCODONE-ACETAMINOPHEN 5-325 MG PO TABS: 1.0000 | ORAL_TABLET | ORAL | Status: DC | PRN

## 2013-07-19 NOTE — ED Notes (Signed)
Front tooth pain for several days

## 2013-07-19 NOTE — ED Provider Notes (Signed)
CSN: [id number]     Arrival date & time 07/19/13  0406 History   First MD Initiated Contact with Patient 07/19/13 0427     No chief complaint on file.    (Consider location/radiation/quality/duration/timing/severity/associated sxs/prior Treatment) HPI Comments: Toothache in R upper central incisor - X  Onset 3 days ago, constdant, worse with palpatoin of tooth and with temperature hypersensitivity.  No fevers, swelling.  States filling fell out last month  The history is provided by the patient and the spouse.    Past Medical History  Diagnosis Date  . Asthma   . Hypercholesteremia   . Thyroid disease   . Diverticulosis    Past Surgical History  Procedure Laterality Date  . Cyst excision    . Leep    . Tubal ligation    . Appendectomy     Family History  Problem Relation Age of Onset  . Hypertension Mother   . Thyroid disease Mother   . Glaucoma Mother    History  Substance Use Topics  . Smoking status: Current Every Day Smoker -- 0.50 packs/day    Types: Cigarettes  . Smokeless tobacco: Not on file  . Alcohol Use: No   OB History   Grav Para Term Preterm Abortions TAB SAB Ect Mult Living                 Review of Systems  Constitutional: Negative for fever and chills.  HENT: Positive for dental problem. Negative for facial swelling, sore throat, trouble swallowing and voice change.        Toothache  Gastrointestinal: Negative for nausea and vomiting.      Allergies  Aspirin; Ibuprofen; and Tramadol  Home Medications   Current Outpatient Rx  Name  Route  Sig  Dispense  Refill  . albuterol (PROVENTIL) 90 MCG/ACT inhaler   Inhalation   Inhale 2 puffs into the lungs 3 (three) times daily.          . Alum & Mag Hydroxide-Simeth (MAGIC MOUTHWASH W/LIDOCAINE) SOLN   Oral   Take 5 mLs by mouth 3 (three) times daily. swish and spit, do not swallow   100 mL   0   . atorvastatin (LIPITOR) 20 MG tablet   Oral   Take 20 mg by mouth every morning.          . cetirizine (ZYRTEC ALLERGY) 10 MG tablet   Oral   Take 10 mg by mouth daily.           Marland Kitchen. dicyclomine (BENTYL) 20 MG tablet   Oral   Take 20 mg by mouth 3 (three) times daily as needed.          . diphenhydrAMINE (BENADRYL) 25 mg capsule   Oral   Take 25 mg by mouth every 4 (four) hours as needed for itching.         . fish oil-omega-3 fatty acids 1000 MG capsule   Oral   Take 1 g by mouth daily.         . fluticasone (FLOVENT HFA) 110 MCG/ACT inhaler   Inhalation   Inhale 2 puffs into the lungs 2 (two) times daily as needed.          Marland Kitchen. glucosamine-chondroitin 500-400 MG tablet   Oral   Take 1 tablet by mouth daily.         Marland Kitchen. ibuprofen (ADVIL,MOTRIN) 200 MG tablet   Oral   Take 800 mg by mouth every 4 (four) hours as  needed for moderate pain.         Marland Kitchen levothyroxine (SYNTHROID, LEVOTHROID) 125 MCG tablet   Oral   Take 125 mcg by mouth daily before breakfast.         . PRESCRIPTION MEDICATION   Both Ears   Place into both ears 3 (three) times daily as needed. Ear drops given at [facility] hosp - use as needed for pain         . ranitidine (ZANTAC) 150 MG tablet   Oral   Take 150 mg by mouth 2 (two) times daily.         Marland Kitchen Spacer/Aero-Holding Chambers (BREATHERITE COLL SPACER ADULT) MISC      Use spacer with inhaler at all times.  This helps to get the medicine in your lungs          . cyclobenzaprine (FLEXERIL) 10 MG tablet   Oral   Take 1 tablet (10 mg total) by mouth 2 (two) times daily as needed for muscle spasms.   20 tablet   0   . HYDROcodone-acetaminophen (NORCO) 5-325 MG per tablet   Oral   Take 1-2 tablets by mouth every 4 (four) hours as needed.   10 tablet   0   . naproxen (NAPROSYN) 500 MG tablet   Oral   Take 1 tablet (500 mg total) by mouth 2 (two) times daily with a meal.   30 tablet   0   . naproxen (NAPROSYN) 500 MG tablet   Oral   Take 1 tablet (500 mg total) by mouth 2 (two) times daily with a meal.   30  tablet   0   . oxyCODONE-acetaminophen (PERCOCET) 5-325 MG per tablet   Oral   Take 1 tablet by mouth every 4 (four) hours as needed.   20 tablet   0   . penicillin v potassium (VEETID) 500 MG tablet   Oral   Take 1 tablet (500 mg total) by mouth 4 (four) times daily.   40 tablet   0    BP 116/71  Pulse 73  Temp(Src) 98.2 F (36.8 C) (Oral)  Resp 16  Ht 5\' 3"  (1.6 m)  Wt 190 lb (86.183 kg)  BMI 33.67 kg/m2  SpO2 100%  LMP 07/14/2013 Physical Exam  Nursing note and vitals reviewed. Constitutional: She appears well-developed and well-nourished. No distress.  HENT:  Head: Normocephalic and atraumatic.  Mouth/Throat: Oropharynx is clear and moist. No oropharyngeal exudate.  Dental Disease - missing filling in lingual side of R upper central incisor - no surrounding redness / swelling or abscess / fluctuance, no jaw asymetry, no pain under tongue, normal voice, MMM  Eyes: Conjunctivae are normal. No scleral icterus.  Neck: Normal range of motion. Neck supple. No thyromegaly present.  Cardiovascular: Normal rate and regular rhythm.   Pulmonary/Chest: Effort normal and breath sounds normal.  Lymphadenopathy:    She has no cervical adenopathy.  Neurological: She is alert.  Skin: Skin is warm and dry. No rash noted. She is not diaphoretic.    ED Course  Procedures (including critical care time) Labs Review Labs Reviewed - No data to display Imaging Review No results found.    MDM   Final diagnoses:  Toothache    Toothache - likely pulpitis / periodontal disease.  Stable to f/u with dentisnt, meds as below  Meds given in ED:  Medications  oxyCODONE-acetaminophen (PERCOCET/ROXICET) 5-325 MG per tablet 2 tablet (not administered)    New Prescriptions  NAPROXEN (NAPROSYN) 500 MG TABLET    Take 1 tablet (500 mg total) by mouth 2 (two) times daily with a meal.   OXYCODONE-ACETAMINOPHEN (PERCOCET) 5-325 MG PER TABLET    Take 1 tablet by mouth every 4 (four) hours  as needed.   PENICILLIN V POTASSIUM (VEETID) 500 MG TABLET    Take 1 tablet (500 mg total) by mouth 4 (four) times daily.        Vida RollerBrian D Obdulia Steier, MD 07/19/13 [phone]

## 2013-08-02 ENCOUNTER — Encounter (HOSPITAL_COMMUNITY): Payer: Self-pay | Admitting: Emergency Medicine

## 2013-08-02 ENCOUNTER — Emergency Department (HOSPITAL_COMMUNITY)
Admission: EM | Admit: 2013-08-02 | Discharge: 2013-08-02 | Disposition: A | Discharge status: Home / Self Care | Payer: No Typology Code available for payment source | Attending: Emergency Medicine | Admitting: Emergency Medicine

## 2013-08-02 DIAGNOSIS — Z9851 Tubal ligation status: Secondary | ICD-10-CM | POA: Insufficient documentation

## 2013-08-02 DIAGNOSIS — Z9089 Acquired absence of other organs: Secondary | ICD-10-CM | POA: Insufficient documentation

## 2013-08-02 DIAGNOSIS — E079 Disorder of thyroid, unspecified: Secondary | ICD-10-CM | POA: Insufficient documentation

## 2013-08-02 DIAGNOSIS — Z79899 Other long term (current) drug therapy: Secondary | ICD-10-CM | POA: Insufficient documentation

## 2013-08-02 DIAGNOSIS — R1032 Left lower quadrant pain: Secondary | ICD-10-CM

## 2013-08-02 DIAGNOSIS — F17200 Nicotine dependence, unspecified, uncomplicated: Secondary | ICD-10-CM | POA: Insufficient documentation

## 2013-08-02 DIAGNOSIS — K5732 Diverticulitis of large intestine without perforation or abscess without bleeding: Secondary | ICD-10-CM | POA: Insufficient documentation

## 2013-08-02 DIAGNOSIS — J45909 Unspecified asthma, uncomplicated: Secondary | ICD-10-CM | POA: Insufficient documentation

## 2013-08-02 DIAGNOSIS — E7800 Pure hypercholesterolemia, unspecified: Secondary | ICD-10-CM | POA: Insufficient documentation

## 2013-08-02 DIAGNOSIS — Z3202 Encounter for pregnancy test, result negative: Secondary | ICD-10-CM | POA: Insufficient documentation

## 2013-08-02 DIAGNOSIS — K5792 Diverticulitis of intestine, part unspecified, without perforation or abscess without bleeding: Secondary | ICD-10-CM

## 2013-08-02 DIAGNOSIS — Z9889 Other specified postprocedural states: Secondary | ICD-10-CM | POA: Insufficient documentation

## 2013-08-02 LAB — URINALYSIS, ROUTINE W REFLEX MICROSCOPIC
Bilirubin Urine: NEGATIVE
GLUCOSE, UA: NEGATIVE mg/dL
HGB URINE DIPSTICK: NEGATIVE
KETONES UR: NEGATIVE mg/dL
LEUKOCYTES UA: NEGATIVE
Nitrite: NEGATIVE
PROTEIN: NEGATIVE mg/dL
Specific Gravity, Urine: 1.025 (ref 1.005–1.030)
UROBILINOGEN UA: 0.2 mg/dL (ref 0.0–1.0)
pH: 6 (ref 5.0–8.0)

## 2013-08-02 LAB — CBC WITH DIFFERENTIAL/PLATELET
BASOS PCT: 0 % (ref 0–1)
Basophils Absolute: 0 10*3/uL (ref 0.0–0.1)
Eosinophils Absolute: 0.1 10*3/uL (ref 0.0–0.7)
Eosinophils Relative: 1 % (ref 0–5)
HEMATOCRIT: 43.5 % (ref 36.0–46.0)
HEMOGLOBIN: 14.7 g/dL (ref 12.0–15.0)
LYMPHS ABS: 2.8 10*3/uL (ref 0.7–4.0)
LYMPHS PCT: 23 % (ref 12–46)
MCH: 28.7 pg (ref 26.0–34.0)
MCHC: 33.8 g/dL (ref 30.0–36.0)
MCV: 85 fL (ref 78.0–100.0)
MONO ABS: 0.8 10*3/uL (ref 0.1–1.0)
MONOS PCT: 7 % (ref 3–12)
NEUTROS ABS: 8.2 10*3/uL — AB (ref 1.7–7.7)
NEUTROS PCT: 69 % (ref 43–77)
Platelets: 333 10*3/uL (ref 150–400)
RBC: 5.12 MIL/uL — AB (ref 3.87–5.11)
RDW: 15.2 % (ref 11.5–15.5)
WBC: 11.8 10*3/uL — AB (ref 4.0–10.5)

## 2013-08-02 LAB — COMPREHENSIVE METABOLIC PANEL
ALBUMIN: 3.7 g/dL (ref 3.5–5.2)
ALK PHOS: 61 U/L (ref 39–117)
ALT: 11 U/L (ref 0–35)
AST: 17 U/L (ref 0–37)
BILIRUBIN TOTAL: 0.4 mg/dL (ref 0.3–1.2)
BUN: 7 mg/dL (ref 6–23)
CHLORIDE: 102 meq/L (ref 96–112)
CO2: 26 meq/L (ref 19–32)
CREATININE: 0.79 mg/dL (ref 0.50–1.10)
Calcium: 9.9 mg/dL (ref 8.4–10.5)
GFR calc Af Amer: >90 mL/min (ref >90)
Glucose, Bld: 100 mg/dL — ABNORMAL HIGH (ref 70–99)
POTASSIUM: 4.4 meq/L (ref 3.7–5.3)
Sodium: 139 mEq/L (ref 137–147)
Total Protein: 7.5 g/dL (ref 6.0–8.3)

## 2013-08-02 LAB — PREGNANCY, URINE: Preg Test, Ur: NEGATIVE

## 2013-08-02 MED ORDER — HYDROCODONE-ACETAMINOPHEN 5-325 MG PO TABS: 2.0000 | ORAL_TABLET | ORAL | Status: DC | PRN

## 2013-08-02 MED ORDER — HYDROMORPHONE HCL PF 2 MG/ML IJ SOLN
2.0000 mg | Freq: Once | INTRAMUSCULAR | Status: AC
  Administered 2013-08-02: 2 mg via INTRAMUSCULAR
  Filled 2013-08-02: qty 1

## 2013-08-02 MED ORDER — CIPROFLOXACIN HCL 500 MG PO TABS: 500.0000 mg | ORAL_TABLET | Freq: Two times a day (BID) | ORAL | Status: DC

## 2013-08-02 MED ORDER — METRONIDAZOLE 500 MG PO TABS: 500.0000 mg | ORAL_TABLET | Freq: Three times a day (TID) | ORAL | Status: DC

## 2013-08-02 NOTE — Discharge Instructions (Signed)
Cipro and Flagyl as prescribed.  Hydrocodone as prescribed as needed for pain.  Return to the emergency department if you develop significant worsening of your discomfort, bloody stool, high fever, or if you're not improving in the next 24-48 hours.   Abdominal Pain, Adult Many things can cause abdominal pain. Usually, abdominal pain is not caused by a disease and will improve without treatment. It can often be observed and treated at home. Your health care provider will do a physical exam and possibly order blood tests and X-rays to help determine the seriousness of your pain. However, in many cases, more time must pass before a clear cause of the pain can be found. Before that point, your health care provider may not know if you need more testing or further treatment. HOME CARE INSTRUCTIONS  Monitor your abdominal pain for any changes. The following actions may help to alleviate any discomfort you are experiencing:  Only take over-the-counter or prescription medicines as directed by your health care provider.  Do not take laxatives unless directed to do so by your health care provider.  Try a clear liquid diet (broth, tea, or water) as directed by your health care provider. Slowly move to a bland diet as tolerated. SEEK MEDICAL CARE IF:  You have unexplained abdominal pain.  You have abdominal pain associated with nausea or diarrhea.  You have pain when you urinate or have a bowel movement.  You experience abdominal pain that wakes you in the night.  You have abdominal pain that is worsened or improved by eating food.  You have abdominal pain that is worsened with eating fatty foods. SEEK IMMEDIATE MEDICAL CARE IF:   Your pain does not go away within 2 hours.  You have a fever.  You keep throwing up (vomiting).  Your pain is felt only in portions of the abdomen, such as the right side or the left lower portion of the abdomen.  You pass bloody or black tarry stools. MAKE  SURE YOU:  Understand these instructions.   Will watch your condition.   Will get help right away if you are not doing well or get worse.  Document Released: 01/01/2005 Document Revised: 01/12/2013 Document Reviewed: 12/01/2012 [facility] Patient Information 2014 [place], [place].  Diverticulitis A diverticulum is a small pouch or sac on the colon. Diverticulosis is the presence of these diverticula on the colon. Diverticulitis is the irritation (inflammation) or infection of diverticula. CAUSES  The colon and its diverticula contain bacteria. If food particles block the tiny opening to a diverticulum, the bacteria inside can grow and cause an increase in pressure. This leads to infection and inflammation and is called diverticulitis. SYMPTOMS   Abdominal pain and tenderness. Usually, the pain is located on the left side of your abdomen. However, it could be located elsewhere.  Fever.  Bloating.  Feeling sick to your stomach (nausea).  Throwing up (vomiting).  Abnormal stools. DIAGNOSIS  Your caregiver will take a history and perform a physical exam. Since many things can cause abdominal pain, other tests may be necessary. Tests may include:  Blood tests.  Urine tests.  X-ray of the abdomen.  CT scan of the abdomen. Sometimes, surgery is needed to determine if diverticulitis or other conditions are causing your symptoms. TREATMENT  Most of the time, you can be treated without surgery. Treatment includes:  Resting the bowels by only having liquids for a few days. As you improve, you will need to eat a low-fiber diet.  Intravenous (IV)  fluids if you are losing body fluids (dehydrated).  Antibiotic medicines that treat infections may be given.  Pain and nausea medicine, if needed.  Surgery if the inflamed diverticulum has burst. HOME CARE INSTRUCTIONS   Try a clear liquid diet (broth, tea, or water for as long as directed by your caregiver). You may then gradually  begin a low-fiber diet as tolerated.  A low-fiber diet is a diet with less than 10 grams of fiber. Choose the foods below to reduce fiber in the diet:  White breads, cereals, rice, and pasta.  Cooked fruits and vegetables or soft fresh fruits and vegetables without the skin.  Ground or well-cooked tender beef, ham, veal, lamb, pork, or poultry.  Eggs and seafood.  After your diverticulitis symptoms have improved, your caregiver may put you on a high-fiber diet. A high-fiber diet includes 14 grams of fiber for every 1000 calories consumed. For a standard 2000 calorie diet, you would need 28 grams of fiber. Follow these diet guidelines to help you increase the fiber in your diet. It is important to slowly increase the amount fiber in your diet to avoid gas, constipation, and bloating.  Choose whole-grain breads, cereals, pasta, and brown rice.  Choose fresh fruits and vegetables with the skin on. Do not overcook vegetables because the more vegetables are cooked, the more fiber is lost.  Choose more nuts, seeds, legumes, dried peas, beans, and lentils.  Look for food products that have greater than 3 grams of fiber per serving on the Nutrition Facts label.  Take all medicine as directed by your caregiver.  If your caregiver has given you a follow-up appointment, it is very important that you go. Not going could result in lasting (chronic) or permanent injury, pain, and disability. If there is any problem keeping the appointment, call to reschedule. SEEK MEDICAL CARE IF:   Your pain does not improve.  You have a hard time advancing your diet beyond clear liquids.  Your bowel movements do not return to normal. SEEK IMMEDIATE MEDICAL CARE IF:   Your pain becomes worse.  You have an oral temperature above 102 F (38.9 C), not controlled by medicine.  You have repeated vomiting.  You have bloody or black, tarry stools.  Symptoms that brought you to your caregiver become worse or are  not getting better. MAKE SURE YOU:   Understand these instructions.  Will watch your condition.  Will get help right away if you are not doing well or get worse. Document Released: 01/01/2005 Document Revised: 06/16/2011 Document Reviewed: 04/29/2010 [facility] Patient Information 2014 [place]ExitCare, [place]LL[place].

## 2013-08-02 NOTE — ED Notes (Signed)
abd pain, hx of diverticulitis,  No  N/v, has had diarrhea.  , no fever.

## 2013-08-02 NOTE — ED Notes (Signed)
Patient given discharge instruction, verbalized understand. Patient ambulatory out of the department.  

## 2013-08-02 NOTE — ED Provider Notes (Signed)
CSN: [id number]     Arrival date & time 08/02/13  1856 History   First MD Initiated Contact with Patient 08/02/13 2009     Chief Complaint  Patient presents with  . Abdominal Pain     (Consider location/radiation/quality/duration/timing/severity/associated sxs/prior Treatment) HPI Comments: Patient is a 38 year old female with history of diverticulitis. She presents today with complaints of left lower quadrant pain that has been worsening over the past week. She has had episodes of diarrhea that have been nonbloody. She denies any vomiting and has not had any fevers. She denies any urinary complaints. She states that this feels similar to her prior episodes of diverticulitis.  Patient is a 38 y.o. female presenting with abdominal pain. The history is provided by the patient.  Abdominal Pain Pain location:  LLQ Pain quality: cramping   Pain radiates to:  Does not radiate Pain severity:  Moderate Onset quality:  Gradual Duration:  7 days Timing:  Constant Progression:  Worsening Chronicity:  Recurrent Relieved by:  Nothing Worsened by:  Nothing tried Ineffective treatments:  None tried   Past Medical History  Diagnosis Date  . Asthma   . Hypercholesteremia   . Thyroid disease   . Diverticulosis    Past Surgical History  Procedure Laterality Date  . Cyst excision    . Leep    . Tubal ligation    . Appendectomy    . Ovarian cyst removal     Family History  Problem Relation Age of Onset  . Hypertension Mother   . Thyroid disease Mother   . Glaucoma Mother    History  Substance Use Topics  . Smoking status: Current Every Day Smoker -- 0.50 packs/day    Types: Cigarettes  . Smokeless tobacco: Not on file  . Alcohol Use: No   OB History   Grav Para Term Preterm Abortions TAB SAB Ect Mult Living                 Review of Systems  Gastrointestinal: Positive for abdominal pain.  All other systems reviewed and are negative.     Allergies  Aspirin; Ibuprofen;  and Tramadol  Home Medications   Prior to Admission medications   Medication Sig Start Date End Date Taking? Authorizing Provider  albuterol (PROAIR HFA) 108 (90 BASE) MCG/ACT inhaler Inhale 2 puffs into the lungs every 6 (six) hours as needed for wheezing or shortness of breath.   Yes Historical Provider, MD  atorvastatin (LIPITOR) 20 MG tablet Take 20 mg by mouth every morning.   Yes Historical Provider, MD  cetirizine (ZYRTEC ALLERGY) 10 MG tablet Take 10 mg by mouth daily.     Yes Historical Provider, MD  dicyclomine (BENTYL) 20 MG tablet Take 20 mg by mouth 3 (three) times daily as needed for spasms.    Yes Historical Provider, MD  fish oil-omega-3 fatty acids 1000 MG capsule Take 1 g by mouth daily.   Yes Historical Provider, MD  fluticasone (FLOVENT HFA) 110 MCG/ACT inhaler Inhale 2 puffs into the lungs 2 (two) times daily as needed.    Yes Historical Provider, MD  glucosamine-chondroitin 500-400 MG tablet Take 1 tablet by mouth daily.   Yes Historical Provider, MD  levothyroxine (SYNTHROID, LEVOTHROID) 125 MCG tablet Take 125 mcg by mouth daily before breakfast.   Yes Historical Provider, MD  ranitidine (ZANTAC) 150 MG tablet Take 150 mg by mouth 2 (two) times daily.   Yes Historical Provider, MD   BP 112/58  Pulse 82  Temp(Src) 97.9 F (36.6 C) (Oral)  Resp 20  Ht 5\' 3"  (1.6 m)  Wt 185 lb (83.915 kg)  BMI 32.78 kg/m2  SpO2 100%  LMP 07/19/2013 Physical Exam  Nursing note and vitals reviewed. Constitutional: She is oriented to person, place, and time. She appears well-developed and well-nourished. No distress.  HENT:  Head: Normocephalic and atraumatic.  Neck: Normal range of motion. Neck supple.  Cardiovascular: Normal rate and regular rhythm.  Exam reveals no gallop and no friction rub.   No murmur heard. Pulmonary/Chest: Effort normal and breath sounds normal. No respiratory distress. She has no wheezes.  Abdominal: Soft. Bowel sounds are normal. She exhibits no  distension. There is tenderness.  There is tenderness to palpation in the left lower quadrant with no rebound and no guarding. Bowel sounds are present.  Musculoskeletal: Normal range of motion.  Neurological: She is alert and oriented to person, place, and time.  Skin: Skin is warm and dry. She is not diaphoretic.    ED Course  Procedures (including critical care time) Labs Review Labs Reviewed  CBC WITH DIFFERENTIAL - Abnormal; Notable for the following:    WBC 11.8 (*)    RBC 5.12 (*)    Neutro Abs 8.2 (*)    All other components within normal limits  COMPREHENSIVE METABOLIC PANEL - Abnormal; Notable for the following:    Glucose, Bld 100 (*)    All other components within normal limits  URINALYSIS, ROUTINE W REFLEX MICROSCOPIC  PREGNANCY, URINE    Imaging Review No results found.   EKG Interpretation None      MDM   Final diagnoses:  None    Patient presents with left lower quadrant pain and workup reveals a white count of 12,000. She states that this feels similar to her prior episodes of diverticulitis and is quite confident that is what is going on. Her physical examination is consistent with this. Urinalysis is clear and patient is not pregnant. Due to her history, nature of her symptoms I feel as though she is appropriate for discharge with antibiotics and when necessary followup. She will be given Cipro and Flagyl for presumed diverticulitis, but understands to return if her symptoms are not improving in the next one to 2 days or she develops bloody stool, high fever, or worsening pain    Geoffery Lyonsouglas West [place], MD 08/02/13 2125

## 2013-09-04 ENCOUNTER — Encounter (HOSPITAL_COMMUNITY): Payer: Self-pay | Admitting: Emergency Medicine

## 2013-09-04 ENCOUNTER — Emergency Department (HOSPITAL_COMMUNITY): Payer: No Typology Code available for payment source

## 2013-09-04 ENCOUNTER — Emergency Department (HOSPITAL_COMMUNITY)
Admission: EM | Admit: 2013-09-04 | Discharge: 2013-09-04 | Disposition: A | Discharge status: Home / Self Care | Payer: No Typology Code available for payment source | Attending: Emergency Medicine | Admitting: Emergency Medicine

## 2013-09-04 DIAGNOSIS — E079 Disorder of thyroid, unspecified: Secondary | ICD-10-CM | POA: Insufficient documentation

## 2013-09-04 DIAGNOSIS — S82009A Unspecified fracture of unspecified patella, initial encounter for closed fracture: Secondary | ICD-10-CM | POA: Insufficient documentation

## 2013-09-04 DIAGNOSIS — Z79899 Other long term (current) drug therapy: Secondary | ICD-10-CM | POA: Insufficient documentation

## 2013-09-04 DIAGNOSIS — J45909 Unspecified asthma, uncomplicated: Secondary | ICD-10-CM | POA: Insufficient documentation

## 2013-09-04 DIAGNOSIS — S82001A Unspecified fracture of right patella, initial encounter for closed fracture: Secondary | ICD-10-CM

## 2013-09-04 DIAGNOSIS — IMO0002 Reserved for concepts with insufficient information to code with codable children: Secondary | ICD-10-CM | POA: Insufficient documentation

## 2013-09-04 DIAGNOSIS — E7800 Pure hypercholesterolemia, unspecified: Secondary | ICD-10-CM | POA: Insufficient documentation

## 2013-09-04 DIAGNOSIS — Z8719 Personal history of other diseases of the digestive system: Secondary | ICD-10-CM | POA: Insufficient documentation

## 2013-09-04 DIAGNOSIS — Y92410 Unspecified street and highway as the place of occurrence of the external cause: Secondary | ICD-10-CM | POA: Insufficient documentation

## 2013-09-04 DIAGNOSIS — S52609A Unspecified fracture of lower end of unspecified ulna, initial encounter for closed fracture: Secondary | ICD-10-CM | POA: Insufficient documentation

## 2013-09-04 DIAGNOSIS — F17200 Nicotine dependence, unspecified, uncomplicated: Secondary | ICD-10-CM | POA: Insufficient documentation

## 2013-09-04 DIAGNOSIS — S52202A Unspecified fracture of shaft of left ulna, initial encounter for closed fracture: Secondary | ICD-10-CM

## 2013-09-04 DIAGNOSIS — Y9389 Activity, other specified: Secondary | ICD-10-CM | POA: Insufficient documentation

## 2013-09-04 MED ORDER — HYDROMORPHONE HCL PF 1 MG/ML IJ SOLN
INTRAMUSCULAR | Status: AC
  Filled 2013-09-04: qty 1

## 2013-09-04 MED ORDER — HYDROMORPHONE HCL PF 1 MG/ML IJ SOLN: 1.0000 mg | Freq: Once | INTRAMUSCULAR | Status: DC

## 2013-09-04 MED ORDER — HYDROMORPHONE HCL PF 1 MG/ML IJ SOLN
1.0000 mg | Freq: Once | INTRAMUSCULAR | Status: AC
  Administered 2013-09-04: 1 mg via INTRAVENOUS

## 2013-09-04 MED ORDER — TETANUS-DIPHTH-ACELL PERTUSSIS 5-2.5-18.5 LF-MCG/0.5 IM SUSP: 0.5000 mL | Freq: Once | INTRAMUSCULAR | Status: DC

## 2013-09-04 MED ORDER — HYDROMORPHONE HCL PF 1 MG/ML IJ SOLN
1.0000 mg | Freq: Once | INTRAMUSCULAR | Status: AC
  Administered 2013-09-04: 1 mg via INTRAVENOUS
  Filled 2013-09-04: qty 1

## 2013-09-04 MED ORDER — OXYCODONE-ACETAMINOPHEN 5-325 MG PO TABS
1.0000 | ORAL_TABLET | Freq: Once | ORAL | Status: AC
  Administered 2013-09-04: 1 via ORAL
  Filled 2013-09-04: qty 1

## 2013-09-04 MED ORDER — OXYCODONE-ACETAMINOPHEN 5-325 MG PO TABS: 1.0000 | ORAL_TABLET | ORAL | Status: DC | PRN
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
## 2013-09-04 NOTE — Discharge Instructions (Signed)
Forearm Fracture Your caregiver has diagnosed you as having a broken bone (fracture) of the forearm. This is the part of your arm between the elbow and your wrist. Your forearm is made up of two bones. These are the radius and ulna. A fracture is a break in one or both bones. A cast or splint is used to protect and keep your injured bone from moving. The cast or splint will be on generally for about 5 to 6 weeks, with individual variations. HOME CARE INSTRUCTIONS   Keep the injured part elevated while sitting or lying down. Keeping the injury above the level of your heart (the center of the chest). This will decrease swelling and pain.  Apply ice to the injury for 15-20 minutes, 03-04 times per day while awake, for 2 days. Put the ice in a plastic bag and place a thin towel between the bag of ice and your cast or splint.  If you have a plaster or fiberglass cast:  Do not try to scratch the skin under the cast using sharp or pointed objects.  Check the skin around the cast every day. You may put lotion on any red or sore areas.  Keep your cast dry and clean.  If you have a plaster splint:  Wear the splint as directed.  You may loosen the elastic around the splint if your fingers become numb, tingle, or turn cold or blue.  Do not put pressure on any part of your cast or splint. It may break. Rest your cast only on a pillow the first 24 hours until it is fully hardened.  Your cast or splint can be protected during bathing with a plastic bag. Do not lower the cast or splint into water.  Only take over-the-counter or prescription medicines for pain, discomfort, or fever as directed by your caregiver. SEEK IMMEDIATE MEDICAL CARE IF:   Your cast gets damaged or breaks.  You have more severe pain or swelling than you did before the cast.  Your skin or nails below the injury turn blue or gray, or feel cold or numb.  There is a bad smell or new stains and/or pus like (purulent) drainage  coming from under the cast. MAKE SURE YOU:   Understand these instructions.  Will watch your condition.  Will get help right away if you are not doing well or get worse. Document Released: 03/21/2000 Document Revised: 06/16/2011 Document Reviewed: 11/11/2007 [facility] Patient Information 2014 [place] EstatesExitCare, [place]LL[place].  Patellar Fracture, Adult A patellar fracture is a break in your kneecap (patella).  CAUSES   A direct blow to the knee or a fall is usually the cause of a broken patella.  A very hard and strong bending of your knee can cause a patellar fracture. RISK FACTORS Involvement in contact sports, especially sports that involve a lot of jumping. SIGNS AND SYMPTOMS   Tender and swollen knee.  Pain when you move your knee, especially when you try to straighten out your leg.  Difficulty walking or putting weight on your knee.  Misshapen knee (as if a bone is out of place). DIAGNOSIS  Patellar fracture is usually diagnosed with a physical exam and an X-ray exam. TREATMENT  Treatment depends on the type of fracture:  If your patella is still in the right position after the fracture and you can still straighten your leg out, you can usually be treated with a splint or cast for 4 6 weeks.  If your patella is broken into multiple small  pieces but you are able to straighten your leg, you can usually be treated with a splint or cast for 4 6 weeks. Sometimes your patella may need to be removed before the cast is applied.  If you cannot straighten out your leg after a patellar fracture, then surgery is required to hold the bony fragments together until they heal. A cast or splint will be applied for 4 6 weeks. HOME CARE INSTRUCTIONS   Only take over-the-counter or prescription medicines for pain, discomfort, or fever as directed by your health care provider.  Use crutches as directed, and exercise the leg as directed.  Apply ice to the injured area:  Put ice in a plastic bag.  Place  a towel between your skin and the bag.  Leave the ice on for 20 minutes, 2 3 times a day.  Elevate the affected knee above the level of your heart. SEEK MEDICAL CARE IF:  You suspect you have significantly injured your knee.  You hear a pop after a knee injury.  Your knee is misshapen after a knee injury.  You have pain when you move your knee.  You have difficulty walking or putting weight on your knee.  You cannot fully move your knee. SEEK IMMEDIATE MEDICAL CARE IF:  You have redness, swelling, or increasing pain in your knee.  You have a fever. Document Released: 12/21/2002 Document Revised: 01/12/2013 Document Reviewed: 11/03/2012 [facility] Patient Information 2014 [place], [place].

## 2013-09-04 NOTE — ED Notes (Signed)
Pt requesting pain medication; informed patient Dilaudid was given at 1556.
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
## 2013-09-04 NOTE — Progress Notes (Signed)
Orthopedic Tech Progress Note Patient Details:  Rachel Coleman 1975-06-13 [id number]  Ortho Devices Type of Ortho Device: Sugartong splint Ortho Device/Splint Location: sugartong splint applied to the left upper extremity. patient is held by doctor while splint is applied.    Early Chars 09/04/2013, 2:40 PM

## 2013-09-05 ENCOUNTER — Encounter (HOSPITAL_BASED_OUTPATIENT_CLINIC_OR_DEPARTMENT_OTHER): Payer: Self-pay | Admitting: *Deleted

## 2013-09-06 ENCOUNTER — Other Ambulatory Visit: Payer: Self-pay | Admitting: Orthopedic Surgery
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
## 2013-09-07 ENCOUNTER — Ambulatory Visit (HOSPITAL_BASED_OUTPATIENT_CLINIC_OR_DEPARTMENT_OTHER)
Admission: RE | Admit: 2013-09-07 | Discharge: 2013-09-07 | Disposition: A | Discharge status: Home / Self Care | Payer: No Typology Code available for payment source | Source: Ambulatory Visit | Attending: Orthopedic Surgery | Admitting: Orthopedic Surgery

## 2013-09-07 ENCOUNTER — Encounter (HOSPITAL_BASED_OUTPATIENT_CLINIC_OR_DEPARTMENT_OTHER): Payer: Self-pay | Admitting: Anesthesiology

## 2013-09-07 ENCOUNTER — Encounter (HOSPITAL_BASED_OUTPATIENT_CLINIC_OR_DEPARTMENT_OTHER)
Admission: RE | Disposition: A | Discharge status: Home / Self Care | Payer: Self-pay | Source: Ambulatory Visit | Attending: Orthopedic Surgery

## 2013-09-07 ENCOUNTER — Encounter (HOSPITAL_BASED_OUTPATIENT_CLINIC_OR_DEPARTMENT_OTHER): Payer: No Typology Code available for payment source | Admitting: Anesthesiology

## 2013-09-07 ENCOUNTER — Ambulatory Visit (HOSPITAL_BASED_OUTPATIENT_CLINIC_OR_DEPARTMENT_OTHER): Payer: No Typology Code available for payment source | Admitting: Anesthesiology

## 2013-09-07 DIAGNOSIS — IMO0002 Reserved for concepts with insufficient information to code with codable children: Secondary | ICD-10-CM | POA: Insufficient documentation

## 2013-09-07 DIAGNOSIS — Z79899 Other long term (current) drug therapy: Secondary | ICD-10-CM | POA: Insufficient documentation

## 2013-09-07 DIAGNOSIS — F17200 Nicotine dependence, unspecified, uncomplicated: Secondary | ICD-10-CM | POA: Diagnosis not present

## 2013-09-07 DIAGNOSIS — S52609A Unspecified fracture of lower end of unspecified ulna, initial encounter for closed fracture: Secondary | ICD-10-CM

## 2013-09-07 DIAGNOSIS — S52209A Unspecified fracture of shaft of unspecified ulna, initial encounter for closed fracture: Secondary | ICD-10-CM | POA: Insufficient documentation

## 2013-09-07 DIAGNOSIS — X58XXXA Exposure to other specified factors, initial encounter: Secondary | ICD-10-CM | POA: Insufficient documentation

## 2013-09-07 DIAGNOSIS — E7800 Pure hypercholesterolemia, unspecified: Secondary | ICD-10-CM | POA: Insufficient documentation

## 2013-09-07 DIAGNOSIS — J039 Acute tonsillitis, unspecified: Secondary | ICD-10-CM | POA: Insufficient documentation

## 2013-09-07 DIAGNOSIS — Y929 Unspecified place or not applicable: Secondary | ICD-10-CM | POA: Insufficient documentation

## 2013-09-07 DIAGNOSIS — E079 Disorder of thyroid, unspecified: Secondary | ICD-10-CM | POA: Insufficient documentation

## 2013-09-07 DIAGNOSIS — K5730 Diverticulosis of large intestine without perforation or abscess without bleeding: Secondary | ICD-10-CM | POA: Insufficient documentation

## 2013-09-07 DIAGNOSIS — H6690 Otitis media, unspecified, unspecified ear: Secondary | ICD-10-CM | POA: Insufficient documentation

## 2013-09-07 DIAGNOSIS — J45909 Unspecified asthma, uncomplicated: Secondary | ICD-10-CM | POA: Insufficient documentation

## 2013-09-07 DIAGNOSIS — Z886 Allergy status to analgesic agent status: Secondary | ICD-10-CM | POA: Diagnosis not present

## 2013-09-07 DIAGNOSIS — Z8719 Personal history of other diseases of the digestive system: Secondary | ICD-10-CM | POA: Insufficient documentation

## 2013-09-07 DIAGNOSIS — Z888 Allergy status to other drugs, medicaments and biological substances status: Secondary | ICD-10-CM | POA: Insufficient documentation

## 2013-09-07 HISTORY — PX: ORIF ULNAR FRACTURE: SHX5417

## 2013-09-07 LAB — POCT HEMOGLOBIN-HEMACUE: Hemoglobin: 13.3 g/dL (ref 12.0–15.0)

## 2013-09-07 SURGERY — OPEN REDUCTION INTERNAL FIXATION (ORIF) ULNAR FRACTURE
Anesthesia: General | Site: Arm Lower | Laterality: Left

## 2013-09-07 MED ORDER — MIDAZOLAM HCL 5 MG/5ML IJ SOLN
INTRAMUSCULAR | Status: DC | PRN
  Administered 2013-09-07: 2 mg via INTRAVENOUS

## 2013-09-07 MED ORDER — OXYCODONE-ACETAMINOPHEN 5-325 MG PO TABS: 1.0000 | ORAL_TABLET | ORAL | Status: DC | PRN

## 2013-09-07 MED ORDER — LIDOCAINE HCL (CARDIAC) 20 MG/ML IV SOLN
INTRAVENOUS | Status: DC | PRN
  Administered 2013-09-07: 50 mg via INTRAVENOUS

## 2013-09-07 MED ORDER — CEFAZOLIN SODIUM-DEXTROSE 2-3 GM-% IV SOLR: 2.0000 g | INTRAVENOUS | Status: DC

## 2013-09-07 MED ORDER — PROPOFOL 10 MG/ML IV BOLUS
INTRAVENOUS | Status: DC | PRN
  Administered 2013-09-07: 200 mg via INTRAVENOUS

## 2013-09-07 MED ORDER — OXYCODONE HCL 5 MG/5ML PO SOLN: 5.0000 mg | Freq: Once | ORAL | Status: AC | PRN

## 2013-09-07 MED ORDER — ONDANSETRON HCL 4 MG/2ML IJ SOLN: 4.0000 mg | Freq: Once | INTRAMUSCULAR | Status: DC | PRN

## 2013-09-07 MED ORDER — FENTANYL CITRATE 0.05 MG/ML IJ SOLN
50.0000 ug | INTRAMUSCULAR | Status: DC | PRN
  Administered 2013-09-07: 100 ug via INTRAVENOUS

## 2013-09-07 MED ORDER — DEXAMETHASONE SODIUM PHOSPHATE 10 MG/ML IJ SOLN
INTRAMUSCULAR | Status: DC | PRN
  Administered 2013-09-07: 10 mg via INTRAVENOUS

## 2013-09-07 MED ORDER — CHLORHEXIDINE GLUCONATE 4 % EX LIQD: 60.0000 mL | Freq: Once | CUTANEOUS | Status: DC

## 2013-09-07 MED ORDER — BUPIVACAINE-EPINEPHRINE (PF) 0.5% -1:200000 IJ SOLN
INTRAMUSCULAR | Status: DC | PRN
  Administered 2013-09-07: 22 mL

## 2013-09-07 MED ORDER — LABETALOL HCL 5 MG/ML IV SOLN
INTRAVENOUS | Status: DC | PRN
  Administered 2013-09-07: 5 mg via INTRAVENOUS

## 2013-09-07 MED ORDER — MIDAZOLAM HCL 2 MG/2ML IJ SOLN
1.0000 mg | INTRAMUSCULAR | Status: DC | PRN
  Administered 2013-09-07: 2 mg via INTRAVENOUS

## 2013-09-07 MED ORDER — CEFAZOLIN SODIUM-DEXTROSE 2-3 GM-% IV SOLR
INTRAVENOUS | Status: DC | PRN
  Administered 2013-09-07: 2 g via INTRAVENOUS

## 2013-09-07 MED ORDER — HYDROMORPHONE HCL PF 1 MG/ML IJ SOLN
0.2500 mg | INTRAMUSCULAR | Status: DC | PRN
  Administered 2013-09-07 (×4): 0.5 mg via INTRAVENOUS

## 2013-09-07 MED ORDER — LACTATED RINGERS IV SOLN
INTRAVENOUS | Status: DC
  Administered 2013-09-07 (×2): via INTRAVENOUS

## 2013-09-07 MED ORDER — FENTANYL CITRATE 0.05 MG/ML IJ SOLN
INTRAMUSCULAR | Status: DC | PRN
  Administered 2013-09-07: 50 ug via INTRAVENOUS
  Administered 2013-09-07: 25 ug via INTRAVENOUS
  Administered 2013-09-07: 50 ug via INTRAVENOUS
  Administered 2013-09-07: 25 ug via INTRAVENOUS
  Administered 2013-09-07: 50 ug via INTRAVENOUS

## 2013-09-07 MED ORDER — OXYCODONE HCL 5 MG PO TABS
5.0000 mg | ORAL_TABLET | Freq: Once | ORAL | Status: AC | PRN
  Administered 2013-09-07: 5 mg via ORAL

## 2013-09-07 MED ORDER — HYDROMORPHONE HCL PF 1 MG/ML IJ SOLN
0.5000 mg | INTRAMUSCULAR | Status: DC | PRN
  Administered 2013-09-07: 0.5 mg via INTRAVENOUS

## 2013-09-07 MED ORDER — ONDANSETRON HCL 4 MG/2ML IJ SOLN
INTRAMUSCULAR | Status: DC | PRN
  Administered 2013-09-07: 4 mg via INTRAVENOUS

## 2013-09-07 SURGICAL SUPPLY — 74 items
APL SKNCLS STERI-STRIP NONHPOA (GAUZE/BANDAGES/DRESSINGS)
BAG DECANTER FOR FLEXI CONT (MISCELLANEOUS) IMPLANT
BANDAGE ELASTIC 3 VELCRO ST LF (GAUZE/BANDAGES/DRESSINGS) IMPLANT
BANDAGE ELASTIC 4 VELCRO ST LF (GAUZE/BANDAGES/DRESSINGS) ×3 IMPLANT
BENZOIN TINCTURE PRP APPL 2/3 (GAUZE/BANDAGES/DRESSINGS) IMPLANT
BIT DRILL 2.5X2.75 QC CALB (BIT) ×2 IMPLANT
BLADE 15 SAFETY STRL DISP (BLADE) ×6 IMPLANT
BLADE MINI RND TIP GREEN BEAV (BLADE) IMPLANT
BNDG CMPR 9X4 STRL LF SNTH (GAUZE/BANDAGES/DRESSINGS)
BNDG ESMARK 4X9 LF (GAUZE/BANDAGES/DRESSINGS) IMPLANT
BNDG GAUZE ELAST 4 BULKY (GAUZE/BANDAGES/DRESSINGS) ×3 IMPLANT
BNDG PLASTER X FAST 3X3 WHT LF (CAST SUPPLIES) ×42 IMPLANT
BNDG PLSTR 9X3 FST ST WHT (CAST SUPPLIES) ×21
CANISTER SUCT 1200ML W/VALVE (MISCELLANEOUS) IMPLANT
CLOSURE WOUND 1/2 X4 (GAUZE/BANDAGES/DRESSINGS)
CORDS BIPOLAR (ELECTRODE) ×3 IMPLANT
COVER TABLE BACK 60X90 (DRAPES) ×3 IMPLANT
CUFF TOURNIQUET SINGLE 18IN (TOURNIQUET CUFF) IMPLANT
DECANTER SPIKE VIAL GLASS SM (MISCELLANEOUS) IMPLANT
DRAPE EXTREMITY T 121X128X90 (DRAPE) ×3 IMPLANT
DRAPE OEC MINIVIEW 54X84 (DRAPES) ×3 IMPLANT
DRAPE SURG 17X23 STRL (DRAPES) ×3 IMPLANT
DURAPREP 26ML APPLICATOR (WOUND CARE) ×3 IMPLANT
ELECT REM PT RETURN 9FT ADLT (ELECTROSURGICAL)
ELECTRODE REM PT RTRN 9FT ADLT (ELECTROSURGICAL) IMPLANT
GAUZE SPONGE 4X4 12PLY STRL (GAUZE/BANDAGES/DRESSINGS) ×3 IMPLANT
GAUZE SPONGE 4X4 16PLY XRAY LF (GAUZE/BANDAGES/DRESSINGS) IMPLANT
GAUZE XEROFORM 1X8 LF (GAUZE/BANDAGES/DRESSINGS) IMPLANT
GLOVE BIO SURGEON STRL SZ 6.5 (GLOVE) ×1 IMPLANT
GLOVE BIO SURGEONS STRL SZ 6.5 (GLOVE) ×1
GLOVE BIOGEL M STRL SZ7.5 (GLOVE) ×2 IMPLANT
GLOVE BIOGEL PI IND STRL 7.0 (GLOVE) IMPLANT
GLOVE BIOGEL PI INDICATOR 7.0 (GLOVE) ×2
GLOVE SURG SYN 8.0 (GLOVE) ×6 IMPLANT
GLOVE SURG SYN 8.0 PF PI (GLOVE) ×2 IMPLANT
GOWN STRL REUS W/ TWL LRG LVL3 (GOWN DISPOSABLE) ×1 IMPLANT
GOWN STRL REUS W/TWL LRG LVL3 (GOWN DISPOSABLE) ×3
GOWN STRL REUS W/TWL XL LVL3 (GOWN DISPOSABLE) ×5 IMPLANT
NDL HYPO 25X1 1.5 SAFETY (NEEDLE) ×1 IMPLANT
NEEDLE HYPO 25X1 1.5 SAFETY (NEEDLE) IMPLANT
NS IRRIG 1000ML POUR BTL (IV SOLUTION) ×3 IMPLANT
PACK BASIN DAY SURGERY FS (CUSTOM PROCEDURE TRAY) ×3 IMPLANT
PAD CAST 3X4 CTTN HI CHSV (CAST SUPPLIES) ×1 IMPLANT
PAD CAST 4YDX4 CTTN HI CHSV (CAST SUPPLIES) IMPLANT
PADDING CAST ABS 4INX4YD NS (CAST SUPPLIES)
PADDING CAST ABS COTTON 4X4 ST (CAST SUPPLIES) ×1 IMPLANT
PADDING CAST COTTON 3X4 STRL (CAST SUPPLIES) ×3
PADDING CAST COTTON 4X4 STRL (CAST SUPPLIES)
PENCIL BUTTON HOLSTER BLD 10FT (ELECTRODE) IMPLANT
PLATE LOCK COMP 5H FOOT (Plate) ×2 IMPLANT
SCREW CORTICAL 3.5MM  16MM (Screw) ×2 IMPLANT
SCREW CORTICAL 3.5MM 14MM (Screw) ×8 IMPLANT
SCREW CORTICAL 3.5MM 16MM (Screw) IMPLANT
SHEET MEDIUM DRAPE 40X70 STRL (DRAPES) ×3 IMPLANT
SPLINT PLASTER CAST XFAST 3X15 (CAST SUPPLIES) IMPLANT
SPLINT PLASTER CAST XFAST 4X15 (CAST SUPPLIES) ×5 IMPLANT
SPLINT PLASTER XTRA FAST SET 4 (CAST SUPPLIES)
SPLINT PLASTER XTRA FASTSET 3X (CAST SUPPLIES)
STOCKINETTE 4X48 STRL (DRAPES) ×3 IMPLANT
STRIP CLOSURE SKIN 1/2X4 (GAUZE/BANDAGES/DRESSINGS) IMPLANT
SUCTION FRAZIER TIP 10 FR DISP (SUCTIONS) ×2 IMPLANT
SUT ETHILON 4 0 PS 2 18 (SUTURE) IMPLANT
SUT MERSILENE 4 0 P 3 (SUTURE) IMPLANT
SUT PROLENE 3 0 PS 2 (SUTURE) IMPLANT
SUT SILK 2 0 FS (SUTURE) IMPLANT
SUT VIC AB 3-0 FS2 27 (SUTURE) IMPLANT
SUT VICRYL RAPIDE 4-0 (SUTURE) IMPLANT
SUT VICRYL RAPIDE 4/0 PS 2 (SUTURE) IMPLANT
SYR BULB 3OZ (MISCELLANEOUS) ×3 IMPLANT
SYRINGE 10CC LL (SYRINGE) ×1 IMPLANT
TOWEL OR 17X24 6PK STRL BLUE (TOWEL DISPOSABLE) ×3 IMPLANT
TUBE CONNECTING 20'X1/4 (TUBING)
TUBE CONNECTING 20X1/4 (TUBING) IMPLANT
UNDERPAD 30X30 INCONTINENT (UNDERPADS AND DIAPERS) ×3 IMPLANT

## 2013-09-07 NOTE — Op Note (Signed)
See note [id number]

## 2013-09-07 NOTE — Anesthesia Preprocedure Evaluation (Signed)
Anesthesia Evaluation  Patient identified by MRN, date of birth, ID band Patient awake    Reviewed: Allergy & Precautions, H&P , NPO status , Patient's Chart, lab work & pertinent test results  Airway Mallampati: I TM Distance: >3 FB Neck ROM: Full    Dental  (+) Teeth Intact, Poor Dentition, Dental Advisory Given, Loose   Pulmonary asthma , Current Smoker,  breath sounds clear to auscultation        Cardiovascular Rhythm:Regular Rate:Normal     Neuro/Psych    GI/Hepatic   Endo/Other    Renal/GU      Musculoskeletal   Abdominal   Peds  Hematology   Anesthesia Other Findings   Reproductive/Obstetrics                           Anesthesia Physical Anesthesia Plan  ASA: II  Anesthesia Plan: General   Post-op Pain Management:    Induction: Intravenous  Airway Management Planned: LMA  Additional Equipment:   Intra-op Plan:   Post-operative Plan: Extubation in OR  Informed Consent: I have reviewed the patients History and Physical, chart, labs and discussed the procedure including the risks, benefits and alternatives for the proposed anesthesia with the patient or authorized representative who has indicated his/her understanding and acceptance.   Dental advisory given  Plan Discussed with: CRNA, Anesthesiologist and Surgeon  Anesthesia Plan Comments:         Anesthesia Quick Evaluation
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
## 2013-09-07 NOTE — Progress Notes (Signed)
  Assisted Dr. Crews with left, ultrasound guided, supraclavicular block. Side rails up, monitors on throughout procedure. See vital signs in flow sheet. Tolerated Procedure well. 

## 2013-09-07 NOTE — Anesthesia Postprocedure Evaluation (Signed)
  Anesthesia Post-op Note  Patient: Rachel Coleman  Procedure(s) Performed: Procedure(s): OPEN REDUCTION INTERNAL FIXATION (ORIF) LEFT ULNAR FRACTURE (Left)  Patient Location: PACU  Anesthesia Type:GA combined with regional for post-op pain  Level of Consciousness: awake, alert  and oriented  Airway and Oxygen Therapy: Patient Spontanous Breathing  Post-op Pain: moderate  Post-op Assessment: Post-op Vital signs reviewed  Post-op Vital Signs: Reviewed  Last Vitals:  Filed Vitals:   09/07/13 1100  BP: 156/65  Pulse: 64  Temp:   Resp: 10    Complications: No apparent anesthesia complications

## 2013-09-07 NOTE — Transfer of Care (Signed)
Immediate Anesthesia Transfer of Care Note  Patient: Rachel Coleman  Procedure(s) Performed: Procedure(s): OPEN REDUCTION INTERNAL FIXATION (ORIF) LEFT ULNAR FRACTURE (Left)  Patient Location: PACU  Anesthesia Type:General and GA combined with regional for post-op pain  Level of Consciousness: awake  Airway & Oxygen Therapy: Patient Spontanous Breathing and Patient connected to nasal cannula oxygen  Post-op Assessment: Report given to PACU RN and Post -op Vital signs reviewed and stable  Post vital signs: Reviewed and stable  Complications: No apparent anesthesia complications

## 2013-09-07 NOTE — Anesthesia Procedure Notes (Addendum)
Anesthesia Regional Block:  Supraclavicular block  Pre-Anesthetic Checklist: ,, timeout performed, Correct Patient, Correct Site, Correct Laterality, Correct Procedure, Correct Position, site marked, Risks and benefits discussed,  Surgical consent,  Pre-op evaluation,  At surgeon's request and post-op pain management  Laterality: Left and Upper  Prep: chloraprep       Needles:  Injection technique: Single-shot  Needle Type: Echogenic Stimulator Needle     Needle Length: 5cm 5 cm Needle Gauge: 21 and 21 G    Additional Needles:  Procedures: ultrasound guided (picture in chart) Supraclavicular block Narrative:  Start time: 09/07/2013 8:05 AM End time: 09/07/2013 8:13 AM Injection made incrementally with aspirations every 5 mL.  Performed by: Personally  Anesthesiologist: Sheldon Silvan   Procedure Name: LMA Insertion Date/Time: 09/07/2013 8:34 AM Performed by: Norlina Desanctis Pre-anesthesia Checklist: Patient identified, Emergency Drugs available, Suction available, Patient being monitored and Timeout performed Patient Re-evaluated:Patient Re-evaluated prior to inductionOxygen Delivery Method: Circle System Utilized Preoxygenation: Pre-oxygenation with 100% oxygen Intubation Type: IV induction Ventilation: Mask ventilation without difficulty LMA: LMA inserted LMA Size: 3.0 Number of attempts: 1 Airway Equipment and Method: bite block Placement Confirmation: positive ETCO2 and breath sounds checked- equal and bilateral Tube secured with: Tape Dental Injury: Teeth and Oropharynx as per pre-operative assessment  Comments: Very poor dentition with multiple caries, decay, portions of upper and lower incisors eroded and missing.  Meticulous care taken when placing LMA and soft cloth bite block.

## 2013-09-07 NOTE — Discharge Instructions (Signed)
°  Post Anesthesia Home Care Instructions ° °Activity: °Get plenty of rest for the remainder of the day. A responsible adult should stay with you for 24 hours following the procedure.  °For the next 24 hours, DO NOT: °-Drive a car °-Operate machinery °-Drink alcoholic beverages °-Take any medication unless instructed by your physician °-Make any legal decisions or sign important papers. ° °Meals: °Start with liquid foods such as gelatin or soup. Progress to regular foods as tolerated. Avoid greasy, spicy, heavy foods. If nausea and/or vomiting occur, drink only clear liquids until the nausea and/or vomiting subsides. Call your physician if vomiting continues. ° °Special Instructions/Symptoms: °Your throat may feel dry or sore from the anesthesia or the breathing tube placed in your throat during surgery. If this causes discomfort, gargle with warm salt water. The discomfort should disappear within 24 hours. ° °Regional Anesthesia Blocks ° °1. Numbness or the inability to move the "blocked" extremity may last from 3-48 hours after placement. The length of time depends on the medication injected and your individual response to the medication. If the numbness is not going away after 48 hours, call your surgeon. ° °2. The extremity that is blocked will need to be protected until the numbness is gone and the  Strength has returned. Because you cannot feel it, you will need to take extra care to avoid injury. Because it may be weak, you may have difficulty moving it or using it. You may not know what position it is in without looking at it while the block is in effect. ° °3. For blocks in the legs and feet, returning to weight bearing and walking needs to be done carefully. You will need to wait until the numbness is entirely gone and the strength has returned. You should be able to move your leg and foot normally before you try and bear weight or walk. You will need someone to be with you when you first try to ensure you  do not fall and possibly risk injury. ° °4. Bruising and tenderness at the needle site are common side effects and will resolve in a few days. ° °5. Persistent numbness or new problems with movement should be communicated to the surgeon or the [facility] ([phone])/ [facility] ([phone]). °

## 2013-09-08 ENCOUNTER — Encounter (HOSPITAL_BASED_OUTPATIENT_CLINIC_OR_DEPARTMENT_OTHER): Payer: Self-pay | Admitting: Orthopedic Surgery
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
## 2013-09-10 ENCOUNTER — Encounter (HOSPITAL_COMMUNITY): Payer: Self-pay | Admitting: Emergency Medicine

## 2013-09-10 ENCOUNTER — Emergency Department (HOSPITAL_COMMUNITY)
Admission: EM | Admit: 2013-09-10 | Discharge: 2013-09-10 | Disposition: A | Discharge status: Home / Self Care | Payer: No Typology Code available for payment source | Attending: Emergency Medicine | Admitting: Emergency Medicine

## 2013-09-10 DIAGNOSIS — J45909 Unspecified asthma, uncomplicated: Secondary | ICD-10-CM | POA: Insufficient documentation

## 2013-09-10 DIAGNOSIS — M79609 Pain in unspecified limb: Secondary | ICD-10-CM | POA: Insufficient documentation

## 2013-09-10 DIAGNOSIS — E7800 Pure hypercholesterolemia, unspecified: Secondary | ICD-10-CM | POA: Insufficient documentation

## 2013-09-10 DIAGNOSIS — E079 Disorder of thyroid, unspecified: Secondary | ICD-10-CM | POA: Insufficient documentation

## 2013-09-10 DIAGNOSIS — M25569 Pain in unspecified knee: Secondary | ICD-10-CM

## 2013-09-10 DIAGNOSIS — F17200 Nicotine dependence, unspecified, uncomplicated: Secondary | ICD-10-CM | POA: Insufficient documentation

## 2013-09-10 DIAGNOSIS — G8911 Acute pain due to trauma: Secondary | ICD-10-CM | POA: Insufficient documentation

## 2013-09-10 DIAGNOSIS — Z8719 Personal history of other diseases of the digestive system: Secondary | ICD-10-CM | POA: Insufficient documentation

## 2013-09-10 DIAGNOSIS — Z79899 Other long term (current) drug therapy: Secondary | ICD-10-CM | POA: Insufficient documentation

## 2013-09-10 DIAGNOSIS — IMO0002 Reserved for concepts with insufficient information to code with codable children: Secondary | ICD-10-CM | POA: Insufficient documentation

## 2013-09-10 DIAGNOSIS — F411 Generalized anxiety disorder: Secondary | ICD-10-CM | POA: Insufficient documentation

## 2013-09-10 DIAGNOSIS — M79603 Pain in arm, unspecified: Secondary | ICD-10-CM

## 2013-09-10 MED ORDER — OXYCODONE-ACETAMINOPHEN 5-325 MG PO TABS: 1.0000 | ORAL_TABLET | ORAL | Status: DC | PRN

## 2013-09-10 MED ORDER — OXYCODONE-ACETAMINOPHEN 5-325 MG PO TABS: 2.0000 | ORAL_TABLET | ORAL | Status: AC | PRN

## 2013-09-10 MED ORDER — HYDROMORPHONE HCL PF 2 MG/ML IJ SOLN
2.0000 mg | Freq: Once | INTRAMUSCULAR | Status: AC
  Administered 2013-09-10: 2 mg via INTRAMUSCULAR
  Filled 2013-09-10: qty 1

## 2013-09-10 MED ORDER — ONDANSETRON 8 MG PO TBDP
8.0000 mg | ORAL_TABLET | Freq: Once | ORAL | Status: AC
  Administered 2013-09-10: 8 mg via ORAL
  Filled 2013-09-10: qty 1
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
## 2013-09-10 NOTE — ED Notes (Signed)
Pt was given a pre-pack of Percocet 5-325 mg quantity six, and instructions on used of medication.

## 2013-09-10 NOTE — ED Notes (Addendum)
Pt and family member continually to desk to ask when pt is going to be placed in a room. Pt advised that they had been called and they were outside. Two more urgent people had to go back before the pt. Pt and family member in waiting room talking about how bad [facility] is. Pt and family made aware that they would be called back shortly.

## 2013-09-10 NOTE — ED Notes (Signed)
Pts in waiting area were made aware of wait times and room availability.

## 2013-09-10 NOTE — ED Notes (Signed)
Pt called for room placement.  No response.  Informed by registration pt had gone outside to smoke.
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
## 2013-09-13 MED FILL — Oxycodone w/ Acetaminophen Tab 5-325 MG: ORAL | Qty: 6 | Status: AC

## 2013-09-28 ENCOUNTER — Emergency Department (HOSPITAL_COMMUNITY)
Admission: EM | Admit: 2013-09-28 | Discharge: 2013-09-28 | Discharge status: Against Medical Advice | Payer: No Typology Code available for payment source | Attending: Emergency Medicine | Admitting: Emergency Medicine

## 2013-09-28 ENCOUNTER — Encounter (HOSPITAL_COMMUNITY): Payer: Self-pay | Admitting: Emergency Medicine

## 2013-09-28 ENCOUNTER — Emergency Department (HOSPITAL_COMMUNITY): Payer: No Typology Code available for payment source

## 2013-09-28 DIAGNOSIS — Z8781 Personal history of (healed) traumatic fracture: Secondary | ICD-10-CM | POA: Insufficient documentation

## 2013-09-28 DIAGNOSIS — S59909A Unspecified injury of unspecified elbow, initial encounter: Secondary | ICD-10-CM | POA: Insufficient documentation

## 2013-09-28 DIAGNOSIS — Y929 Unspecified place or not applicable: Secondary | ICD-10-CM | POA: Insufficient documentation

## 2013-09-28 DIAGNOSIS — Y9389 Activity, other specified: Secondary | ICD-10-CM | POA: Diagnosis not present

## 2013-09-28 DIAGNOSIS — Z9889 Other specified postprocedural states: Secondary | ICD-10-CM | POA: Diagnosis not present

## 2013-09-28 DIAGNOSIS — Z79899 Other long term (current) drug therapy: Secondary | ICD-10-CM | POA: Diagnosis not present

## 2013-09-28 DIAGNOSIS — Z8719 Personal history of other diseases of the digestive system: Secondary | ICD-10-CM | POA: Insufficient documentation

## 2013-09-28 DIAGNOSIS — E079 Disorder of thyroid, unspecified: Secondary | ICD-10-CM | POA: Insufficient documentation

## 2013-09-28 DIAGNOSIS — J45909 Unspecified asthma, uncomplicated: Secondary | ICD-10-CM | POA: Insufficient documentation

## 2013-09-28 DIAGNOSIS — E7800 Pure hypercholesterolemia, unspecified: Secondary | ICD-10-CM | POA: Insufficient documentation

## 2013-09-28 DIAGNOSIS — X58XXXA Exposure to other specified factors, initial encounter: Secondary | ICD-10-CM | POA: Insufficient documentation

## 2013-09-28 DIAGNOSIS — S59919A Unspecified injury of unspecified forearm, initial encounter: Principal | ICD-10-CM

## 2013-09-28 DIAGNOSIS — IMO0002 Reserved for concepts with insufficient information to code with codable children: Secondary | ICD-10-CM | POA: Insufficient documentation

## 2013-09-28 DIAGNOSIS — F17200 Nicotine dependence, unspecified, uncomplicated: Secondary | ICD-10-CM | POA: Insufficient documentation

## 2013-09-28 DIAGNOSIS — M79632 Pain in left forearm: Secondary | ICD-10-CM

## 2013-09-28 DIAGNOSIS — S6990XA Unspecified injury of unspecified wrist, hand and finger(s), initial encounter: Principal | ICD-10-CM

## 2013-09-28 NOTE — ED Notes (Signed)
Pt sts she had arm surgery 3 weeks ago. Pt sts last night she "snagged thumb fingernail on a curtain" pt c/o left lower arm/wrist pain

## 2013-09-28 NOTE — Discharge Instructions (Signed)
Ibuprofen 600 mg rotated with Tylenol 1000 mg every 3 hours as needed for pain.  Call your surgeon in the morning to arrange further refills of your medications.   Musculoskeletal Pain Musculoskeletal pain is muscle and boney aches and pains. These pains can occur in any part of the body. Your caregiver may treat you without knowing the cause of the pain. They may treat you if blood or urine tests, X-rays, and other tests were normal.  CAUSES There is often not a definite cause or reason for these pains. These pains may be caused by a type of germ (virus). The discomfort may also come from overuse. Overuse includes working out too hard when your body is not fit. Boney aches also come from weather changes. Bone is sensitive to atmospheric pressure changes. HOME CARE INSTRUCTIONS   Ask when your test results will be ready. Make sure you get your test results.  Only take over-the-counter or prescription medicines for pain, discomfort, or fever as directed by your caregiver. If you were given medications for your condition, do not drive, operate machinery or power tools, or sign legal documents for 24 hours. Do not drink alcohol. Do not take sleeping pills or other medications that may interfere with treatment.  Continue all activities unless the activities cause more pain. When the pain lessens, slowly resume normal activities. Gradually increase the intensity and duration of the activities or exercise.  During periods of severe pain, bed rest may be helpful. Lay or sit in any position that is comfortable.  Putting ice on the injured area.  Put ice in a bag.  Place a towel between your skin and the bag.  Leave the ice on for 15 to 20 minutes, 3 to 4 times a day.  Follow up with your caregiver for continued problems and no reason can be found for the pain. If the pain becomes worse or does not go away, it may be necessary to repeat tests or do additional testing. Your caregiver may need to  look further for a possible cause. SEEK IMMEDIATE MEDICAL CARE IF:  You have pain that is getting worse and is not relieved by medications.  You develop chest pain that is associated with shortness or breath, sweating, feeling sick to your stomach (nauseous), or throw up (vomit).  Your pain becomes localized to the abdomen.  You develop any new symptoms that seem different or that concern you. MAKE SURE YOU:   Understand these instructions.  Will watch your condition.  Will get help right away if you are not doing well or get worse. Document Released: 03/24/2005 Document Revised: 06/16/2011 Document Reviewed: 11/26/2012 [facility] Patient Information 2015 [employer] [place], [place]. This information is not intended to replace advice given to you by your health care provider. Make sure you discuss any questions you have with your health care provider.

## 2013-09-28 NOTE — ED Notes (Signed)
Pt sts she is out of percocet 5-325mg  and "that is the only thing that helps my pain"

## 2013-09-28 NOTE — ED Notes (Signed)
Patient and patients family member left out of emergency department prior to being discharged. ERMD made aware of patients decision to leave prior to discharge-per ermd [facility]

## 2013-09-28 NOTE — ED Provider Notes (Addendum)
CSN: [id number]     Arrival date & time 09/28/13  0040 History   First MD Initiated Contact with Patient 09/28/13 0058     Chief Complaint  Patient presents with  . Arm Injury    Left lower arm pain started yesterday     (Consider location/radiation/quality/duration/timing/severity/associated sxs/prior Treatment) HPI Comments: Patient is a 38 year old female who is 3 weeks status post left wrist surgery for a fracture. She presents tonight complaining that she snagged her thumbnail last night and then her wrist backward. She is having increased pain since this time. She denies any numbness or tingling. She denies any weakness.  Patient is a 38 y.o. female presenting with arm injury. The history is provided by the patient.  Arm Injury Location:  Wrist Injury: yes   Wrist location:  L wrist Pain details:    Quality:  Sharp   Radiates to:  Does not radiate   Severity:  Severe   Timing:  Constant   Progression:  Worsening Chronicity:  New   Past Medical History  Diagnosis Date  . Asthma   . Hypercholesteremia   . Thyroid disease   . Diverticulosis    Past Surgical History  Procedure Laterality Date  . Leep    . Tubal ligation    . Appendectomy    . Laparoscopic ovarian cystectomy Left 01/07/2008  . Laparoscopic unilateral salpingectomy Left 01/07/2008    distal salpingectomy  . Orif ulnar fracture Left 09/07/2013    Procedure: OPEN REDUCTION INTERNAL FIXATION (ORIF) LEFT ULNAR FRACTURE;  Surgeon: Marlowe ShoresMatthew A Weingold, MD;  Location: [facility];  Service: Orthopedics;  Laterality: Left;   Family History  Problem Relation Age of Onset  . Hypertension Mother   . Thyroid disease Mother   . Glaucoma Mother    History  Substance Use Topics  . Smoking status: Current Every Day Smoker -- 0.50 packs/day    Types: Cigarettes  . Smokeless tobacco: Not on file  . Alcohol Use: No   OB History   Grav Para Term Preterm Abortions TAB SAB Ect Mult Living                  Review of Systems  All other systems reviewed and are negative.     Allergies  Aspirin; Ibuprofen; Toradol; and Tramadol  Home Medications   Prior to Admission medications   Medication Sig Start Date End Date Taking? Authorizing Ayham Word  albuterol (PROAIR HFA) 108 (90 BASE) MCG/ACT inhaler Inhale 2 puffs into the lungs every 6 (six) hours as needed for wheezing or shortness of breath.   Yes Historical Miliyah Luper, MD  atorvastatin (LIPITOR) 20 MG tablet Take 20 mg by mouth every morning.   Yes Historical Kyndle Schlender, MD  cetirizine (ZYRTEC ALLERGY) 10 MG tablet Take 10 mg by mouth daily.     Yes Historical Loel Betancur, MD  dicyclomine (BENTYL) 20 MG tablet Take 20 mg by mouth 3 (three) times daily before meals.    Yes Historical Jamarria Real, MD  fish oil-omega-3 fatty acids 1000 MG capsule Take 1 g by mouth daily.   Yes Historical Dean Goldner, MD  fluticasone (FLOVENT HFA) 110 MCG/ACT inhaler Inhale 2 puffs into the lungs 2 (two) times daily as needed (asthma).    Yes Historical Rayaan Lorah, MD  glucosamine-chondroitin 500-400 MG tablet Take 1 tablet by mouth daily.   Yes Historical Arrow Emmerich, MD  levothyroxine (SYNTHROID, LEVOTHROID) 125 MCG tablet Take 125 mcg by mouth daily before breakfast.   Yes Historical Qadir Folks, MD  ranitidine (ZANTAC) 150 MG tablet Take 150 mg by mouth 2 (two) times daily.   Yes Historical Simmie Garin, MD  Tetrahydrozoline HCl (VISINE OP) Place 2 drops into both eyes daily as needed (dry eyes).   Yes Historical Aundra Espin, MD  oxyCODONE-acetaminophen (PERCOCET) 5-325 MG per tablet Take 1 tablet by mouth every 4 (four) hours as needed for severe pain. 09/10/13   Flint MelterElliott L Wentz, MD  oxyCODONE-acetaminophen (PERCOCET/ROXICET) 5-325 MG per tablet Take 2 tablets by mouth every 4 (four) hours as needed for moderate pain or severe pain. 09/10/13   Flint MelterElliott L Wentz, MD   BP 116/58  Pulse 92  Temp(Src) 98.2 F (36.8 C) (Oral)  Resp 20  Ht 5\' 3"  (1.6 m)  Wt 188 lb (85.276 kg)  BMI 33.31  kg/m2  SpO2 98%  LMP 09/10/2013 Physical Exam  Nursing note and vitals reviewed. Constitutional: She is oriented to person, place, and time. She appears well-developed and well-nourished. No distress.  HENT:  Head: Normocephalic and atraumatic.  Neck: Normal range of motion. Neck supple.  Musculoskeletal:  The left wrist is noted to have a surgical wound present. It is healing well without erythema or drainage. The incision runs lengthwise along the ulnar aspect of the forearm. There is no significant swelling or deformity otherwise. Distal pulses, motor, and sensory are intact to the hand.  Neurological: She is alert and oriented to person, place, and time.  Skin: Skin is warm and dry. She is not diaphoretic.    ED Course  Procedures (including critical care time) Labs Review Labs Reviewed - No data to display  Imaging Review No results found.   EKG Interpretation None      MDM   Final diagnoses:  None    Patient presents here with complaints of pain in her forearm. She is 3 weeks status post surgery by Dr. Mina MarbleWeingold to repair the ulnar fracture. X-rays today reveal no evidence for hardware complication, dislocation, or fracture. The wound appears to be healing well and there is no significant swelling or deformity. I do not feel as though any further workup is indicated.  She is out of her Percocet and I have advised her that she can followup with Dr. Mina MarbleWeingold in the morning to discuss a refill for this. She has filled 8 prescriptions for Percocet over the past 3 weeks for a total of 240 pills. On June 5 she filled a prescription for 30 Percocet, then filled a prescription for 20 more the following day. I am uncomfortable with prescribing additional pain medication for her. I have advised her that she should call Dr. Ronie SpiesWeingold's office in the morning to discuss further refills.  She also has reported allergies to aspirin, ibuprofen, Toradol, tramadol, however she has informed me  she has been taking ibuprofen with no relief. I am not sure as to why this appears on her allergy list.  She will be advised to wear her brace until her next followup appointment with her surgeon.    Geoffery Lyonsouglas Delo, MD 09/28/13 0140  Geoffery Lyonsouglas Delo, MD 09/28/13 0142   Addendum: Patient eloped from the emergency department prior receiving her discharge instructions. I understand she was unhappy that I would not refill her Percocet prescription.  Geoffery Lyonsouglas Delo, MD 09/28/13 [phone]

## 2013-12-08 ENCOUNTER — Other Ambulatory Visit: Payer: Self-pay | Admitting: Orthopedic Surgery

## 2014-01-05 DIAGNOSIS — Z969 Presence of functional implant, unspecified: Secondary | ICD-10-CM

## 2014-01-05 DIAGNOSIS — M7750 Other enthesopathy of unspecified foot: Secondary | ICD-10-CM

## 2014-01-05 DIAGNOSIS — S83511A Sprain of anterior cruciate ligament of right knee, initial encounter: Secondary | ICD-10-CM

## 2014-01-05 HISTORY — DX: Other enthesopathy of unspecified foot and ankle: M77.50

## 2014-01-05 HISTORY — DX: Presence of functional implant, unspecified: Z96.9

## 2014-01-05 HISTORY — DX: Sprain of anterior cruciate ligament of right knee, initial encounter: S83.511A

## 2014-01-06 ENCOUNTER — Encounter (HOSPITAL_BASED_OUTPATIENT_CLINIC_OR_DEPARTMENT_OTHER): Payer: Self-pay | Admitting: *Deleted

## 2014-01-09 ENCOUNTER — Encounter (HOSPITAL_BASED_OUTPATIENT_CLINIC_OR_DEPARTMENT_OTHER)
Admission: RE | Admit: 2014-01-09 | Discharge: 2014-01-09 | Disposition: A | Discharge status: Home / Self Care | Payer: No Typology Code available for payment source | Source: Ambulatory Visit | Attending: Orthopedic Surgery | Admitting: Orthopedic Surgery

## 2014-01-11 ENCOUNTER — Encounter (HOSPITAL_BASED_OUTPATIENT_CLINIC_OR_DEPARTMENT_OTHER)
Admission: RE | Disposition: A | Discharge status: Home / Self Care | Payer: Self-pay | Source: Ambulatory Visit | Attending: Orthopedic Surgery

## 2014-01-11 ENCOUNTER — Ambulatory Visit (HOSPITAL_BASED_OUTPATIENT_CLINIC_OR_DEPARTMENT_OTHER)
Admission: RE | Admit: 2014-01-11 | Discharge: 2014-01-11 | Disposition: A | Discharge status: Home / Self Care | Payer: No Typology Code available for payment source | Source: Ambulatory Visit | Attending: Orthopedic Surgery | Admitting: Orthopedic Surgery

## 2014-01-11 ENCOUNTER — Encounter (HOSPITAL_BASED_OUTPATIENT_CLINIC_OR_DEPARTMENT_OTHER): Payer: No Typology Code available for payment source | Admitting: Anesthesiology

## 2014-01-11 ENCOUNTER — Ambulatory Visit (HOSPITAL_BASED_OUTPATIENT_CLINIC_OR_DEPARTMENT_OTHER): Payer: No Typology Code available for payment source | Admitting: Anesthesiology

## 2014-01-11 ENCOUNTER — Encounter (HOSPITAL_BASED_OUTPATIENT_CLINIC_OR_DEPARTMENT_OTHER): Payer: Self-pay | Admitting: Anesthesiology

## 2014-01-11 DIAGNOSIS — E780 Pure hypercholesterolemia: Secondary | ICD-10-CM | POA: Diagnosis not present

## 2014-01-11 DIAGNOSIS — Z886 Allergy status to analgesic agent status: Secondary | ICD-10-CM | POA: Insufficient documentation

## 2014-01-11 DIAGNOSIS — F17200 Nicotine dependence, unspecified, uncomplicated: Secondary | ICD-10-CM | POA: Insufficient documentation

## 2014-01-11 DIAGNOSIS — E039 Hypothyroidism, unspecified: Secondary | ICD-10-CM | POA: Diagnosis not present

## 2014-01-11 DIAGNOSIS — Z885 Allergy status to narcotic agent status: Secondary | ICD-10-CM | POA: Insufficient documentation

## 2014-01-11 DIAGNOSIS — S52602D Unspecified fracture of lower end of left ulna, subsequent encounter for closed fracture with routine healing: Secondary | ICD-10-CM

## 2014-01-11 DIAGNOSIS — Z888 Allergy status to other drugs, medicaments and biological substances status: Secondary | ICD-10-CM | POA: Diagnosis not present

## 2014-01-11 DIAGNOSIS — K219 Gastro-esophageal reflux disease without esophagitis: Secondary | ICD-10-CM | POA: Insufficient documentation

## 2014-01-11 DIAGNOSIS — T8484XA Pain due to internal orthopedic prosthetic devices, implants and grafts, initial encounter: Secondary | ICD-10-CM | POA: Insufficient documentation

## 2014-01-11 DIAGNOSIS — K5790 Diverticulosis of intestine, part unspecified, without perforation or abscess without bleeding: Secondary | ICD-10-CM | POA: Diagnosis not present

## 2014-01-11 DIAGNOSIS — Z79899 Other long term (current) drug therapy: Secondary | ICD-10-CM | POA: Insufficient documentation

## 2014-01-11 DIAGNOSIS — J45909 Unspecified asthma, uncomplicated: Secondary | ICD-10-CM | POA: Insufficient documentation

## 2014-01-11 DIAGNOSIS — K589 Irritable bowel syndrome without diarrhea: Secondary | ICD-10-CM | POA: Insufficient documentation

## 2014-01-11 DIAGNOSIS — M5136 Other intervertebral disc degeneration, lumbar region: Secondary | ICD-10-CM | POA: Diagnosis not present

## 2014-01-11 HISTORY — DX: Hypoglycemia, unspecified: E16.2

## 2014-01-11 HISTORY — DX: Gastro-esophageal reflux disease without esophagitis: K21.9

## 2014-01-11 HISTORY — DX: Irritable bowel syndrome, unspecified: K58.9

## 2014-01-11 HISTORY — DX: Adverse effect of unspecified anesthetic, initial encounter: T41.45XA

## 2014-01-11 HISTORY — DX: Sprain of anterior cruciate ligament of right knee, initial encounter: S83.511A

## 2014-01-11 HISTORY — PX: HARDWARE REMOVAL: SHX979

## 2014-01-11 HISTORY — DX: Other intervertebral disc degeneration, lumbar region without mention of lumbar back pain or lower extremity pain: M51.369

## 2014-01-11 HISTORY — DX: Other intervertebral disc degeneration, lumbar region: M51.36

## 2014-01-11 HISTORY — DX: Other complications of anesthesia, initial encounter: T88.59XA

## 2014-01-11 HISTORY — DX: Other enthesopathy of unspecified foot and ankle: M77.50

## 2014-01-11 HISTORY — DX: Hypothyroidism, unspecified: E03.9

## 2014-01-11 HISTORY — DX: Headache, unspecified: R51.9

## 2014-01-11 HISTORY — DX: Presence of functional implant, unspecified: Z96.9

## 2014-01-11 HISTORY — DX: Headache: R51

## 2014-01-11 HISTORY — DX: Other specified behavioral and emotional disorders with onset usually occurring in childhood and adolescence: F98.8

## 2014-01-11 LAB — POCT HEMOGLOBIN-HEMACUE: Hemoglobin: 13.4 g/dL (ref 12.0–15.0)

## 2014-01-11 SURGERY — REMOVAL, HARDWARE
Anesthesia: General | Laterality: Left

## 2014-01-11 SURGERY — REMOVAL, HARDWARE
Anesthesia: General | Site: Arm Lower | Laterality: Left

## 2014-01-11 MED ORDER — MIDAZOLAM HCL 2 MG/2ML IJ SOLN: 1.0000 mg | INTRAMUSCULAR | Status: DC | PRN

## 2014-01-11 MED ORDER — MIDAZOLAM HCL 2 MG/2ML IJ SOLN
INTRAMUSCULAR | Status: AC
  Filled 2014-01-11: qty 2

## 2014-01-11 MED ORDER — LACTATED RINGERS IV SOLN
INTRAVENOUS | Status: DC
  Administered 2014-01-11 (×2): via INTRAVENOUS

## 2014-01-11 MED ORDER — HYDROMORPHONE HCL 1 MG/ML IJ SOLN
INTRAMUSCULAR | Status: AC
  Filled 2014-01-11: qty 1

## 2014-01-11 MED ORDER — CEFAZOLIN SODIUM-DEXTROSE 2-3 GM-% IV SOLR
2.0000 g | INTRAVENOUS | Status: AC
  Administered 2014-01-11: 2 g via INTRAVENOUS

## 2014-01-11 MED ORDER — FENTANYL CITRATE 0.05 MG/ML IJ SOLN: 50.0000 ug | INTRAMUSCULAR | Status: DC | PRN

## 2014-01-11 MED ORDER — BUPIVACAINE HCL (PF) 0.25 % IJ SOLN
INTRAMUSCULAR | Status: AC
  Filled 2014-01-11: qty 30

## 2014-01-11 MED ORDER — CEFAZOLIN SODIUM-DEXTROSE 2-3 GM-% IV SOLR
INTRAVENOUS | Status: AC
  Filled 2014-01-11: qty 50

## 2014-01-11 MED ORDER — DEXAMETHASONE SODIUM PHOSPHATE 10 MG/ML IJ SOLN
INTRAMUSCULAR | Status: DC | PRN
  Administered 2014-01-11: 10 mg via INTRAVENOUS

## 2014-01-11 MED ORDER — HYDROMORPHONE HCL 1 MG/ML IJ SOLN: 0.2500 mg | INTRAMUSCULAR | Status: DC | PRN

## 2014-01-11 MED ORDER — MEPERIDINE HCL 25 MG/ML IJ SOLN: 6.2500 mg | INTRAMUSCULAR | Status: DC | PRN

## 2014-01-11 MED ORDER — OXYCODONE-ACETAMINOPHEN 5-325 MG PO TABS: 1.0000 | ORAL_TABLET | ORAL | Status: AC | PRN

## 2014-01-11 MED ORDER — OXYCODONE HCL 5 MG/5ML PO SOLN: 5.0000 mg | Freq: Once | ORAL | Status: AC | PRN

## 2014-01-11 MED ORDER — FENTANYL CITRATE 0.05 MG/ML IJ SOLN
INTRAMUSCULAR | Status: AC
  Filled 2014-01-11: qty 6

## 2014-01-11 MED ORDER — ONDANSETRON HCL 4 MG/2ML IJ SOLN
INTRAMUSCULAR | Status: DC | PRN
  Administered 2014-01-11: 4 mg via INTRAVENOUS

## 2014-01-11 MED ORDER — CHLORHEXIDINE GLUCONATE 4 % EX LIQD: 60.0000 mL | Freq: Once | CUTANEOUS | Status: DC

## 2014-01-11 MED ORDER — ONDANSETRON HCL 4 MG/2ML IJ SOLN: 4.0000 mg | Freq: Once | INTRAMUSCULAR | Status: DC | PRN

## 2014-01-11 MED ORDER — OXYCODONE HCL 5 MG PO TABS
ORAL_TABLET | ORAL | Status: AC
  Filled 2014-01-11: qty 1

## 2014-01-11 MED ORDER — MIDAZOLAM HCL 5 MG/5ML IJ SOLN
INTRAMUSCULAR | Status: DC | PRN
  Administered 2014-01-11: 2 mg via INTRAVENOUS

## 2014-01-11 MED ORDER — PROPOFOL 10 MG/ML IV BOLUS
INTRAVENOUS | Status: DC | PRN
  Administered 2014-01-11: 200 mg via INTRAVENOUS

## 2014-01-11 MED ORDER — LIDOCAINE HCL (PF) 1 % IJ SOLN
INTRAMUSCULAR | Status: AC
  Filled 2014-01-11: qty 30

## 2014-01-11 MED ORDER — MIDAZOLAM HCL 2 MG/ML PO SYRP: 12.0000 mg | ORAL_SOLUTION | Freq: Once | ORAL | Status: DC | PRN

## 2014-01-11 MED ORDER — HYDROMORPHONE HCL 1 MG/ML IJ SOLN
0.2500 mg | INTRAMUSCULAR | Status: DC | PRN
  Administered 2014-01-11 (×5): 0.5 mg via INTRAVENOUS

## 2014-01-11 MED ORDER — OXYCODONE HCL 5 MG PO TABS
5.0000 mg | ORAL_TABLET | Freq: Once | ORAL | Status: AC | PRN
  Administered 2014-01-11: 5 mg via ORAL

## 2014-01-11 MED ORDER — FENTANYL CITRATE 0.05 MG/ML IJ SOLN
INTRAMUSCULAR | Status: DC | PRN
  Administered 2014-01-11 (×2): 50 ug via INTRAVENOUS
  Administered 2014-01-11: 100 ug via INTRAVENOUS

## 2014-01-11 MED ORDER — LIDOCAINE HCL (CARDIAC) 20 MG/ML IV SOLN
INTRAVENOUS | Status: DC | PRN
  Administered 2014-01-11: 50 mg via INTRAVENOUS

## 2014-01-11 MED ORDER — BUPIVACAINE HCL (PF) 0.25 % IJ SOLN
INTRAMUSCULAR | Status: DC | PRN
  Administered 2014-01-11: 10 mL

## 2014-01-11 SURGICAL SUPPLY — 58 items
APL SKNCLS STERI-STRIP NONHPOA (GAUZE/BANDAGES/DRESSINGS) ×1
BANDAGE ELASTIC 3 VELCRO ST LF (GAUZE/BANDAGES/DRESSINGS) ×1 IMPLANT
BANDAGE ELASTIC 4 VELCRO ST LF (GAUZE/BANDAGES/DRESSINGS) ×3 IMPLANT
BENZOIN TINCTURE PRP APPL 2/3 (GAUZE/BANDAGES/DRESSINGS) ×2 IMPLANT
BLADE MINI RND TIP GREEN BEAV (BLADE) IMPLANT
BLADE SURG 15 STRL LF DISP TIS (BLADE) ×1 IMPLANT
BLADE SURG 15 STRL SS (BLADE) ×3
BNDG CMPR 9X4 STRL LF SNTH (GAUZE/BANDAGES/DRESSINGS) ×1
BNDG ESMARK 4X9 LF (GAUZE/BANDAGES/DRESSINGS) ×2 IMPLANT
BNDG GAUZE ELAST 4 BULKY (GAUZE/BANDAGES/DRESSINGS) ×3 IMPLANT
CLOSURE WOUND 1/2 X4 (GAUZE/BANDAGES/DRESSINGS) ×1
CORDS BIPOLAR (ELECTRODE) ×3 IMPLANT
COVER BACK TABLE 60X90IN (DRAPES) ×3 IMPLANT
CUFF TOURNIQUET SINGLE 18IN (TOURNIQUET CUFF) ×2 IMPLANT
DECANTER SPIKE VIAL GLASS SM (MISCELLANEOUS) IMPLANT
DRAPE EXTREMITY TIBURON (DRAPES) ×3 IMPLANT
DRAPE OEC MINIVIEW 54X84 (DRAPES) IMPLANT
DRAPE SURG 17X23 STRL (DRAPES) ×3 IMPLANT
DURAPREP 26ML APPLICATOR (WOUND CARE) ×3 IMPLANT
GAUZE SPONGE 4X4 12PLY STRL (GAUZE/BANDAGES/DRESSINGS) ×3 IMPLANT
GAUZE SPONGE 4X4 16PLY XRAY LF (GAUZE/BANDAGES/DRESSINGS) IMPLANT
GAUZE XEROFORM 1X8 LF (GAUZE/BANDAGES/DRESSINGS) IMPLANT
GLOVE BIO SURGEON STRL SZ7.5 (GLOVE) ×2 IMPLANT
GLOVE BIOGEL PI IND STRL 8 (GLOVE) IMPLANT
GLOVE BIOGEL PI INDICATOR 8 (GLOVE) ×2
GLOVE SURG SYN 8.0 (GLOVE) ×3 IMPLANT
GLOVE SURG SYN 8.0 PF PI (GLOVE) ×2 IMPLANT
GOWN STRL REUS W/ TWL LRG LVL3 (GOWN DISPOSABLE) ×1 IMPLANT
GOWN STRL REUS W/TWL LRG LVL3 (GOWN DISPOSABLE) ×3
GOWN STRL REUS W/TWL XL LVL3 (GOWN DISPOSABLE) ×3 IMPLANT
NDL HYPO 25X1 1.5 SAFETY (NEEDLE) IMPLANT
NEEDLE HYPO 25X1 1.5 SAFETY (NEEDLE) ×3 IMPLANT
PACK BASIN DAY SURGERY FS (CUSTOM PROCEDURE TRAY) ×3 IMPLANT
PAD CAST 4YDX4 CTTN HI CHSV (CAST SUPPLIES) ×1 IMPLANT
PADDING CAST ABS 4INX4YD NS (CAST SUPPLIES) ×2
PADDING CAST ABS COTTON 4X4 ST (CAST SUPPLIES) ×1 IMPLANT
PADDING CAST COTTON 4X4 STRL (CAST SUPPLIES) ×3
SHEET MEDIUM DRAPE 40X70 STRL (DRAPES) ×3 IMPLANT
SPLINT PLASTER CAST XFAST 4X15 (CAST SUPPLIES) IMPLANT
SPLINT PLASTER XTRA FAST SET 4 (CAST SUPPLIES) ×30
STOCKINETTE 4X48 STRL (DRAPES) ×3 IMPLANT
STRIP CLOSURE SKIN 1/2X4 (GAUZE/BANDAGES/DRESSINGS) ×1 IMPLANT
SUT ETHILON 3 0 PS 1 (SUTURE) IMPLANT
SUT ETHILON 5 0 PS 2 18 (SUTURE) IMPLANT
SUT MON AB 4-0 PC3 18 (SUTURE) IMPLANT
SUT PROLENE 3 0 PS 2 (SUTURE) ×2 IMPLANT
SUT VIC AB 0 CT3 27 (SUTURE) IMPLANT
SUT VIC AB 0 SH 27 (SUTURE) IMPLANT
SUT VIC AB 2-0 SH 27 (SUTURE) ×3
SUT VIC AB 2-0 SH 27XBRD (SUTURE) IMPLANT
SUT VIC AB 3-0 PS1 18 (SUTURE)
SUT VIC AB 3-0 PS1 18XBRD (SUTURE) IMPLANT
SUT VICRYL 4-0 PS2 18IN ABS (SUTURE) IMPLANT
SUT VICRYL RAPIDE 4-0 (SUTURE) IMPLANT
SUT VICRYL RAPIDE 4/0 PS 2 (SUTURE) IMPLANT
SYR BULB 3OZ (MISCELLANEOUS) ×3 IMPLANT
SYRINGE 10CC LL (SYRINGE) ×2 IMPLANT
UNDERPAD 30X30 INCONTINENT (UNDERPADS AND DIAPERS) ×3 IMPLANT

## 2014-01-11 NOTE — Op Note (Signed)
See note [phone]

## 2014-01-11 NOTE — Transfer of Care (Signed)
Immediate Anesthesia Transfer of Care Note  Patient: Luella CookKimberly D Kopecky  Procedure(s) Performed: Procedure(s): REMOVAL LEFT ULNA PLATE  (Left)  Patient Location: PACU  Anesthesia Type:General  Level of Consciousness: awake and alert   Airway & Oxygen Therapy: Patient Spontanous Breathing and Patient connected to face mask oxygen  Post-op Assessment: Report given to PACU RN and Post -op Vital signs reviewed and stable  Post vital signs: Reviewed and stable  Complications: No apparent anesthesia complications

## 2014-01-11 NOTE — Anesthesia Preprocedure Evaluation (Signed)
Anesthesia Evaluation  Patient identified by MRN, date of birth, ID band Patient awake    Reviewed: Allergy & Precautions, H&P , NPO status , Patient's Chart, lab work & pertinent test results  Airway Mallampati: I TM Distance: >3 FB Neck ROM: Full    Dental   Pulmonary Current Smoker,          Cardiovascular     Neuro/Psych    GI/Hepatic GERD-  Medicated and Controlled,  Endo/Other  Hypothyroidism   Renal/GU      Musculoskeletal   Abdominal   Peds  Hematology   Anesthesia Other Findings   Reproductive/Obstetrics                           Anesthesia Physical Anesthesia Plan  ASA: II  Anesthesia Plan: General   Post-op Pain Management:    Induction: Intravenous  Airway Management Planned: LMA  Additional Equipment:   Intra-op Plan:   Post-operative Plan: Extubation in OR  Informed Consent: I have reviewed the patients History and Physical, chart, labs and discussed the procedure including the risks, benefits and alternatives for the proposed anesthesia with the patient or authorized representative who has indicated his/her understanding and acceptance.     Plan Discussed with: CRNA and Surgeon  Anesthesia Plan Comments:         Anesthesia Quick Evaluation

## 2014-01-11 NOTE — Anesthesia Postprocedure Evaluation (Signed)
Anesthesia Post Note  Patient: Rachel Coleman  Procedure(s) Performed: Procedure(s) (LRB): REMOVAL LEFT ULNA PLATE  (Left)  Anesthesia type: general  Patient location: PACU  Post pain: Pain level controlled  Post assessment: Patient's Cardiovascular Status Stable  Last Vitals:  Filed Vitals:   01/11/14 1051  BP: 112/65  Pulse: 66  Temp: 36.6 C  Resp: 16    Post vital signs: Reviewed and stable  Level of consciousness: sedated  Complications: No apparent anesthesia complications

## 2014-01-11 NOTE — Discharge Instructions (Signed)
°  Post Anesthesia Home Care Instructions  Activity: Get plenty of rest for the remainder of the day. A responsible adult should stay with you for 24 hours following the procedure.  For the next 24 hours, DO NOT: -Drive a car -Advertising copywriterperate machinery -Drink alcoholic beverages -Take any medication unless instructed by your physician -Make any legal decisions or sign important papers.  Meals: Start with liquid foods such as gelatin or soup. Progress to regular foods as tolerated. Avoid greasy, spicy, heavy foods. If nausea and/or vomiting occur, drink only clear liquids until the nausea and/or vomiting subsides. Call your physician if vomiting continues.  Special Instructions/Symptoms: Your throat may feel dry or sore from the anesthesia or the breathing tube placed in your throat during surgery. If this causes discomfort, gargle with warm salt water. The discomfort should disappear within 24 hours.   Dr. Mina MarbleWeingold  [phone]

## 2014-01-11 NOTE — Op Note (Signed)
NAMAvie Echevaria:  Rachel Coleman, Rachel Coleman          ACCOUNT NO.:  [id number]  MEDICAL RECORD NO.:  [id number]  LOCATION:                                FACILITY:  [facility]  PHYSICIAN:  Artist PaisMatthew A. Jaela Yepez, M.D.DATE OF BIRTH:  [birth year]  DATE OF PROCEDURE:  01/11/2014 DATE OF DISCHARGE:  01/11/2014                              OPERATIVE REPORT   PREOPERATIVE DIAGNOSIS:  Retained painful hardware, loss of motion ulna left side.  POSTOPERATIVE DIAGNOSIS:  Retained painful hardware, loss of motion ulna left side.  PROCEDURE:  Hardware removal, left ulna; and manipulation of right wrist forearm.  SURGEON:  Artist PaisMatthew A. Mina MarbleWeingold, M.D.  ASSISTANT:  None.  ANESTHESIA:  General.  COMPLICATIONS:  No complications.  DRAINS:  No drains.  DESCRIPTION OF PROCEDURE:  The patient was taken to the operating suite. After induction of adequate general anesthetic, left upper extremity was prepped and draped in usual sterile fashion.  An Esmarch was used to exsanguinate the limb.  Tourniquet was then inflated to 250 mmHg.  At this point in time, using approximately a two-thirds of our old incision skin was incised.  Interval between the ECU and FCU was identified and split.  The plate was identified and removed with no undue difficulty. Wound was thoroughly irrigated.  Prior to wound closure, we gently manipulated into full pronation and supination, oozed some contracted tissue at both the proximal and distal radioulnar joint.  The wound was then closed in layers of 2-0 undyed Vicryl and a 3-0 Prolene subcuticular stitch on the skin.  Steri-Strips, 4x4s, fluffs, and a compressive dressing was applied.  The patient tolerated the procedure well and went to recovery room in stable  fashion.     Artist PaisMatthew A. Mina MarbleWeingold, M.D.     MAW/MEDQ  D:  01/11/2014  T:  01/11/2014  Job:  [id number]

## 2014-01-11 NOTE — H&P (Signed)
Rachel Coleman is an 38 y.o. female.   Chief Complaint: left ulna hardware pain HPI: as above s/p orif left distal ulna  Past Medical History  Diagnosis Date  . Hypercholesteremia   . Diverticulosis   . Complication of anesthesia     was hard to wake up with 09/2013 surgery  . Headache     stress, sinus  . GERD (gastroesophageal reflux disease)   . Irritable bowel syndrome (IBS)   . Hypothyroidism   . ADD (attention deficit disorder)     no current med.  . Asthma     daily/prn inhaler  . Hypoglycemia   . Retained orthopedic hardware 01/2014    left ulna  . Bone spur of foot 01/2014    right  . Right ACL tear 01/2014  . DDD (degenerative disc disease), lumbar     Past Surgical History  Procedure Laterality Date  . Leep    . Tubal ligation    . Appendectomy    . Laparoscopic ovarian cystectomy Left 01/07/2008  . Laparoscopic unilateral salpingectomy Left 01/07/2008    distal salpingectomy  . Orif ulnar fracture Left 09/07/2013    Procedure: OPEN REDUCTION INTERNAL FIXATION (ORIF) LEFT ULNAR FRACTURE;  Surgeon: Marlowe ShoresMatthew A Anijah Spohr, MD;  Location: [facility];  Service: Orthopedics;  Laterality: Left;    Family History  Problem Relation Age of Onset  . Hypertension Mother   . Thyroid disease Mother   . Glaucoma Mother    Social History:  reports that she has been smoking Cigarettes.  She has a 12.5 pack-year smoking history. She has never used smokeless tobacco. She reports that she does not drink alcohol or use illicit drugs.  Allergies:  Allergies  Allergen Reactions  . Aspirin Shortness Of Breath  . Adhesive [Tape] Other (See Comments)    TEARS SKIN  . Hydrocodone Nausea And Vomiting and Rash  . Tramadol Nausea And Vomiting  . Toradol [Ketorolac Tromethamine] Rash    Medications Prior to Admission  Medication Sig Dispense Refill  . acetaminophen (TYLENOL) 325 MG tablet Take 650 mg by mouth every 6 (six) hours as needed.      Marland Kitchen. albuterol  (PROAIR HFA) 108 (90 BASE) MCG/ACT inhaler Inhale 2 puffs into the lungs every 6 (six) hours as needed for wheezing or shortness of breath.      Marland Kitchen. atorvastatin (LIPITOR) 20 MG tablet Take 20 mg by mouth every morning.      . cetirizine (ZYRTEC ALLERGY) 10 MG tablet Take 10 mg by mouth daily.        Marland Kitchen. dicyclomine (BENTYL) 20 MG tablet Take 20 mg by mouth 3 (three) times daily before meals.       . fish oil-omega-3 fatty acids 1000 MG capsule Take 1 g by mouth daily.      . fluticasone (FLOVENT HFA) 110 MCG/ACT inhaler Inhale 2 puffs into the lungs 2 (two) times daily as needed (asthma).       . glucosamine-chondroitin 500-400 MG tablet Take 1 tablet by mouth daily.      Marland Kitchen. ibuprofen (ADVIL,MOTRIN) 200 MG tablet Take 200 mg by mouth every 6 (six) hours as needed.      Marland Kitchen. levothyroxine (SYNTHROID, LEVOTHROID) 125 MCG tablet Take 112 mcg by mouth daily before breakfast.       . Multiple Vitamin (MULTIVITAMIN) tablet Take 1 tablet by mouth daily.      . naproxen sodium (ANAPROX) 220 MG tablet Take 220 mg by mouth 2 (two)  times daily with a meal.      . oxyCODONE-acetaminophen (PERCOCET/ROXICET) 5-325 MG per tablet Take 2 tablets by mouth every 4 (four) hours as needed for moderate pain or severe pain.  6 tablet  0  . ranitidine (ZANTAC) 150 MG tablet Take 150 mg by mouth 2 (two) times daily.      . Tetrahydrozoline HCl (VISINE OP) Place 2 drops into both eyes daily as needed (dry eyes).        No results found for this or any previous visit (from the past 48 hour(s)). No results found.  Review of Systems  All other systems reviewed and are negative.   Height 5\' 3"  (1.6 m), weight 85.276 kg (188 lb), last menstrual period 01/05/2014. Physical Exam  Constitutional: She is oriented to person, place, and time. She appears well-developed and well-nourished.  HENT:  Head: Normocephalic and atraumatic.  Cardiovascular: Normal rate.   Respiratory: Effort normal.  Musculoskeletal:       Left wrist:  She exhibits tenderness.  Painful left ulnar plate s/p ORIF  Neurological: She is alert and oriented to person, place, and time.  Skin: Skin is warm.  Psychiatric: She has a normal mood and affect. Her behavior is normal. Judgment and thought content normal.     Assessment/Plan As above   Plan plate removal  Rachel Coleman A 01/11/2014, 7:17 AM

## 2014-01-12 ENCOUNTER — Encounter (HOSPITAL_BASED_OUTPATIENT_CLINIC_OR_DEPARTMENT_OTHER): Payer: Self-pay | Admitting: Orthopedic Surgery

## 2014-04-10 ENCOUNTER — Other Ambulatory Visit (HOSPITAL_COMMUNITY): Payer: Self-pay | Admitting: Family Medicine

## 2014-04-10 DIAGNOSIS — M545 Low back pain: Secondary | ICD-10-CM

## 2014-04-12 ENCOUNTER — Ambulatory Visit (HOSPITAL_COMMUNITY)
Admission: RE | Admit: 2014-04-12 | Discharge: 2014-04-12 | Disposition: A | Discharge status: Home / Self Care | Payer: No Typology Code available for payment source | Source: Ambulatory Visit | Attending: Family Medicine | Admitting: Family Medicine

## 2014-04-12 DIAGNOSIS — M79605 Pain in left leg: Secondary | ICD-10-CM | POA: Insufficient documentation

## 2014-04-12 DIAGNOSIS — M5126 Other intervertebral disc displacement, lumbar region: Secondary | ICD-10-CM | POA: Insufficient documentation

## 2014-04-12 DIAGNOSIS — M4806 Spinal stenosis, lumbar region: Secondary | ICD-10-CM | POA: Insufficient documentation

## 2014-04-12 DIAGNOSIS — M5127 Other intervertebral disc displacement, lumbosacral region: Secondary | ICD-10-CM | POA: Insufficient documentation

## 2014-04-12 DIAGNOSIS — M545 Low back pain: Secondary | ICD-10-CM

## 2014-04-12 DIAGNOSIS — M79604 Pain in right leg: Secondary | ICD-10-CM | POA: Insufficient documentation

## 2015-05-18 IMAGING — CR DG LUMBAR SPINE COMPLETE 4+V
5 series · 5 of 5 positions shown · non-contrast
Comparison: Lumbar films 10/28/2012

CLINICAL DATA: back pain, trauma

EXAM:
LUMBAR SPINE - COMPLETE 4+ VIEW

[t lumbar spine ap]
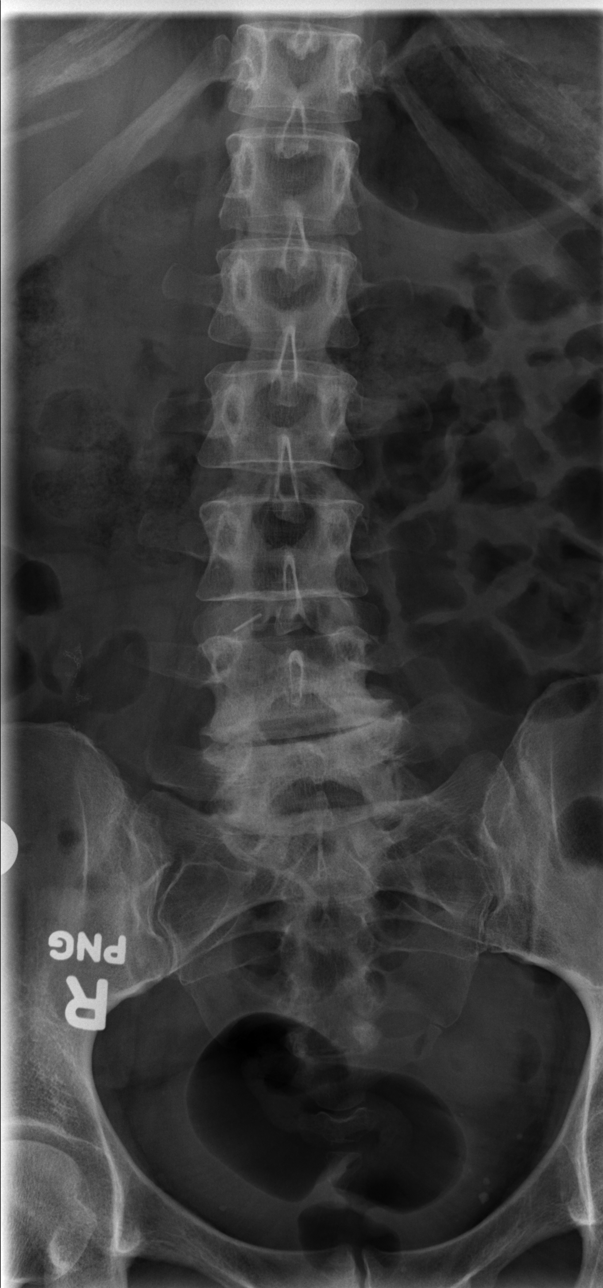

[t lumbar spine obl (1 of 2)]
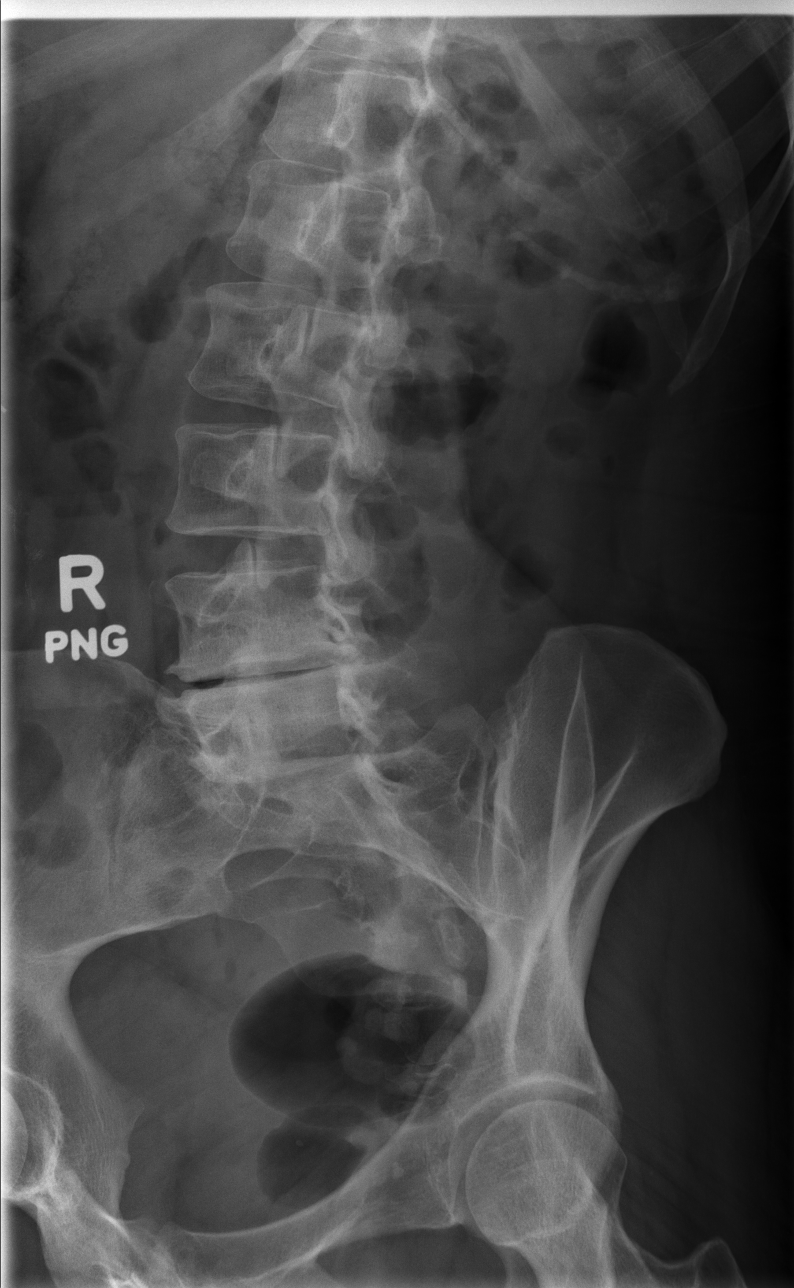

[t lumbar spine obl (2 of 2)]
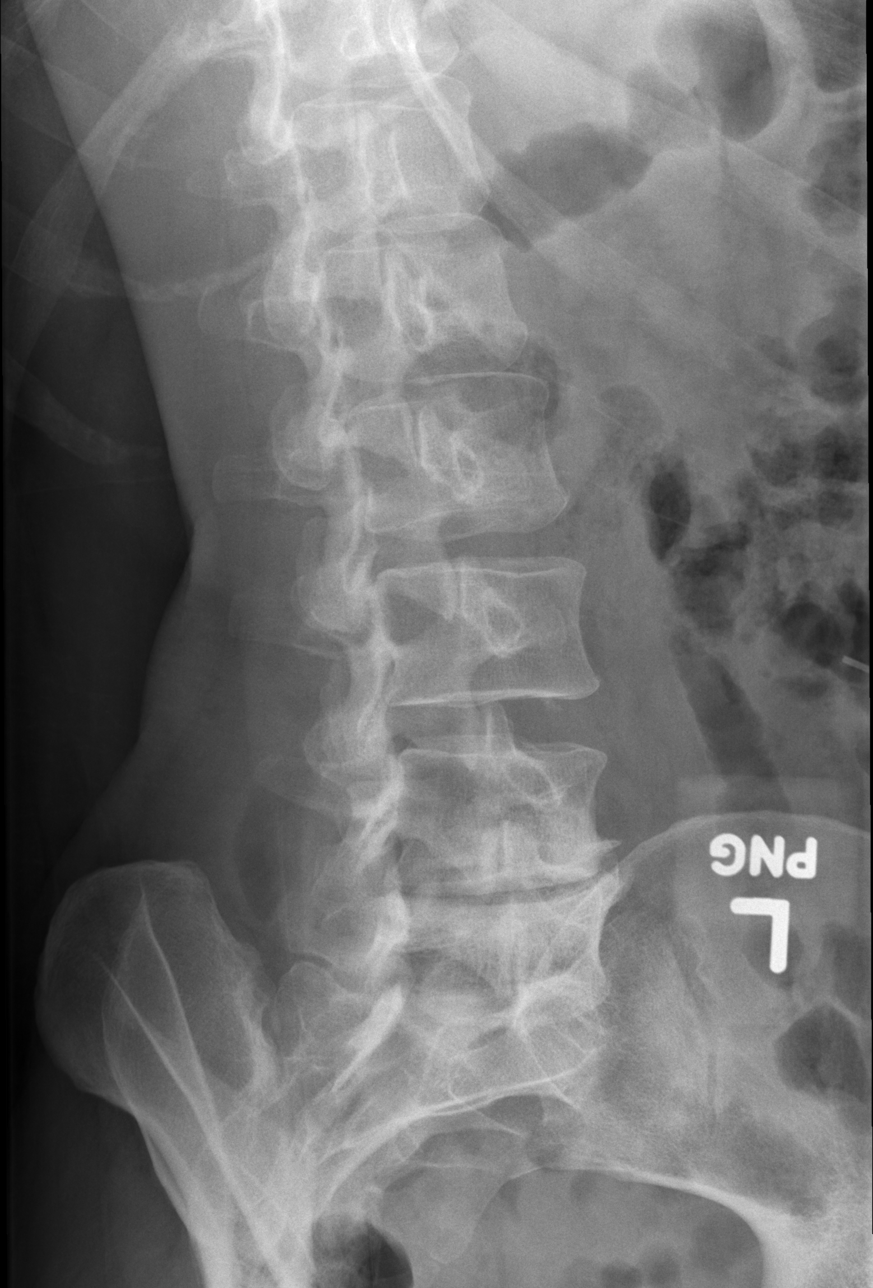

[t lumbar spine lat]
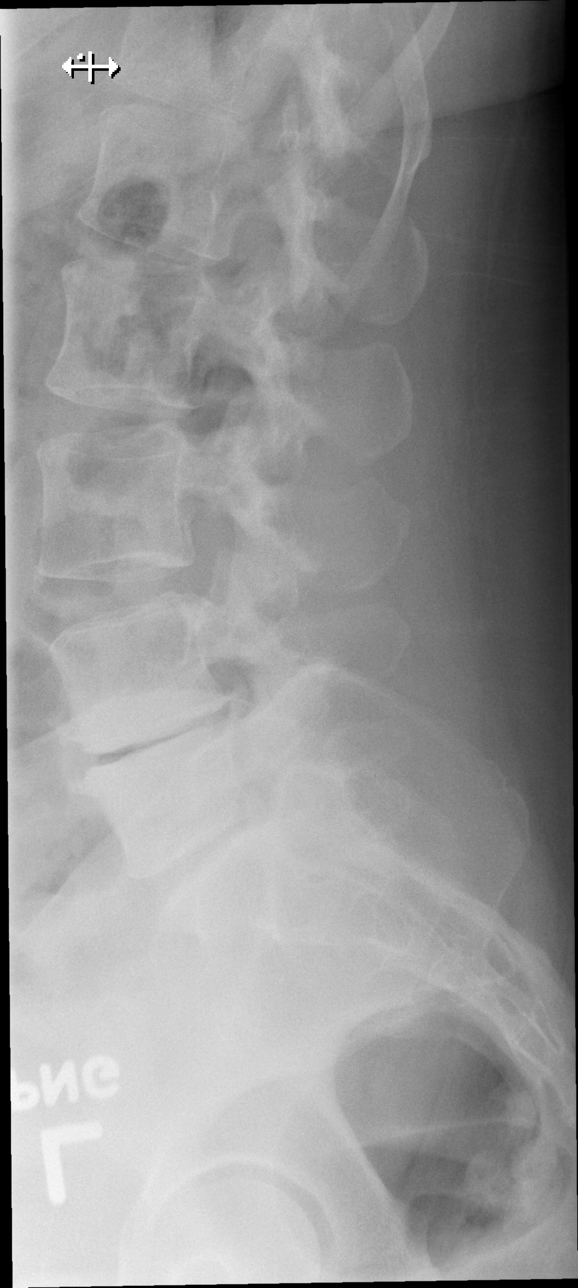

[t lumbar l-5 s-1 spot]
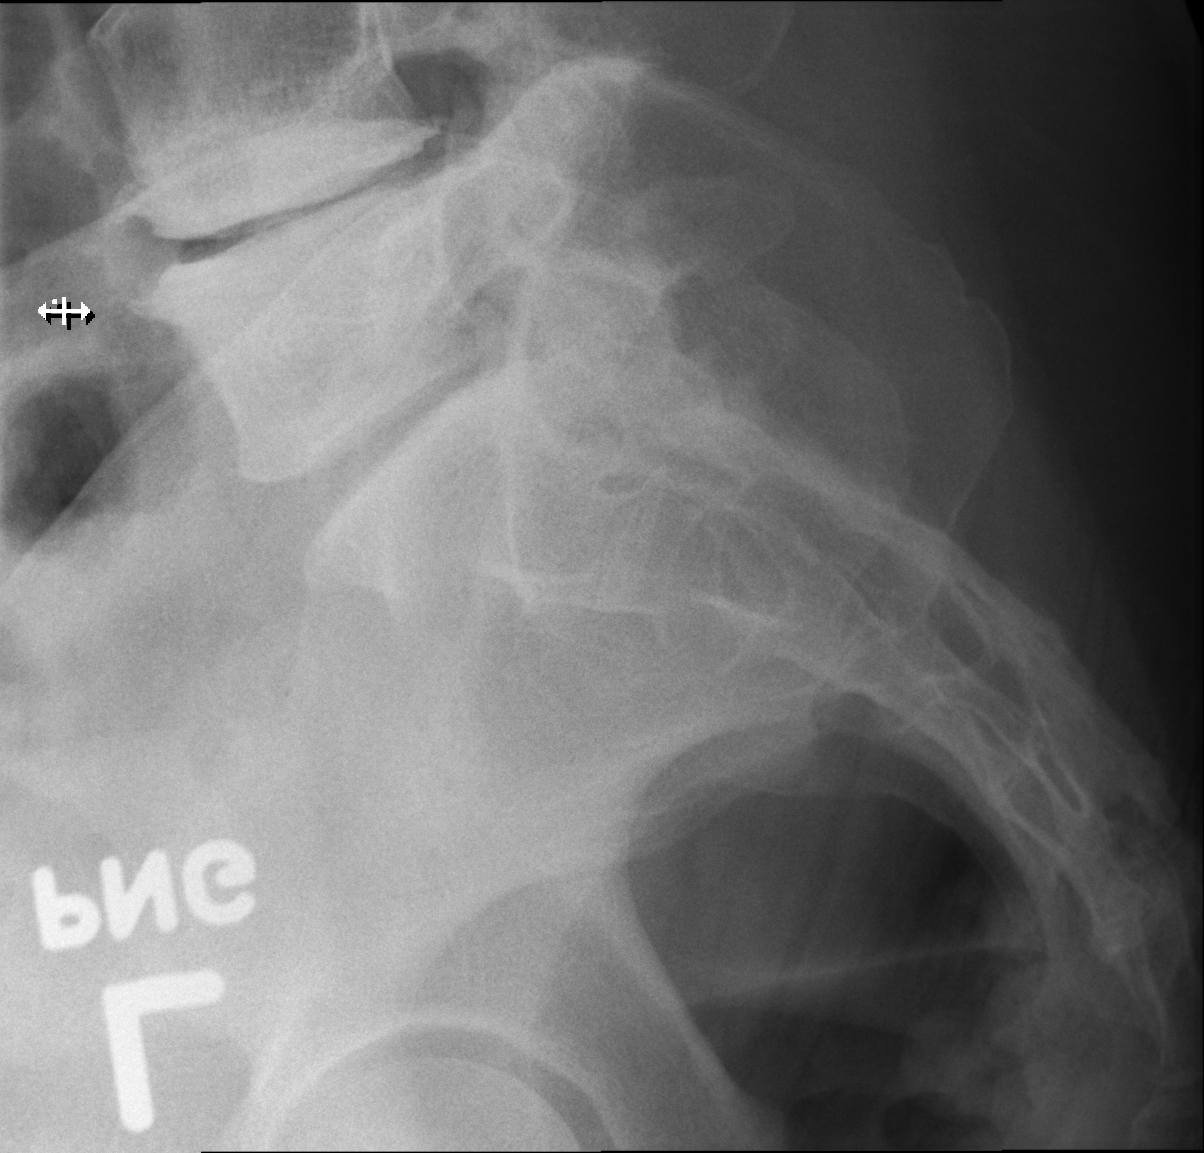

[5 of 5 positions shown; findings below may reference images not displayed]

FINDINGS: Normal alignment of the lumbar vertebral bodies. There is loss of
disc space height and endplate sclerosis at L4-L5. No pars fracture.
IMPRESSION: 1. No evidence of lumbar spine trauma.
2. Degenerative change at L4-L5 similar prior.

## 2015-05-18 IMAGING — CR DG FOREARM 2V*L*
2 series · 2 of 2 positions shown · non-contrast
Comparison: None.

CLINICAL DATA: Trauma

EXAM:
LEFT FOREARM - 2 VIEW

[x forearm lat left]
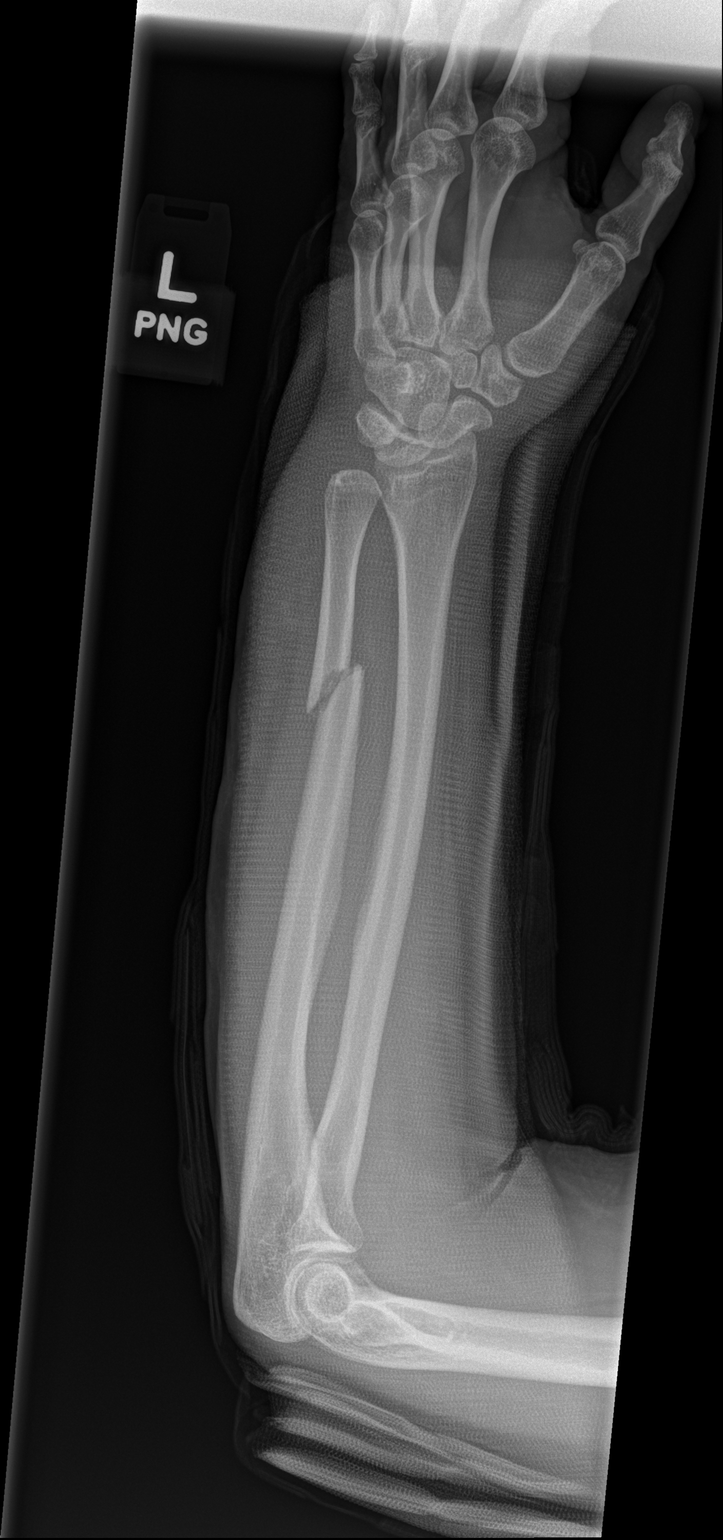

[x forearm ap left]
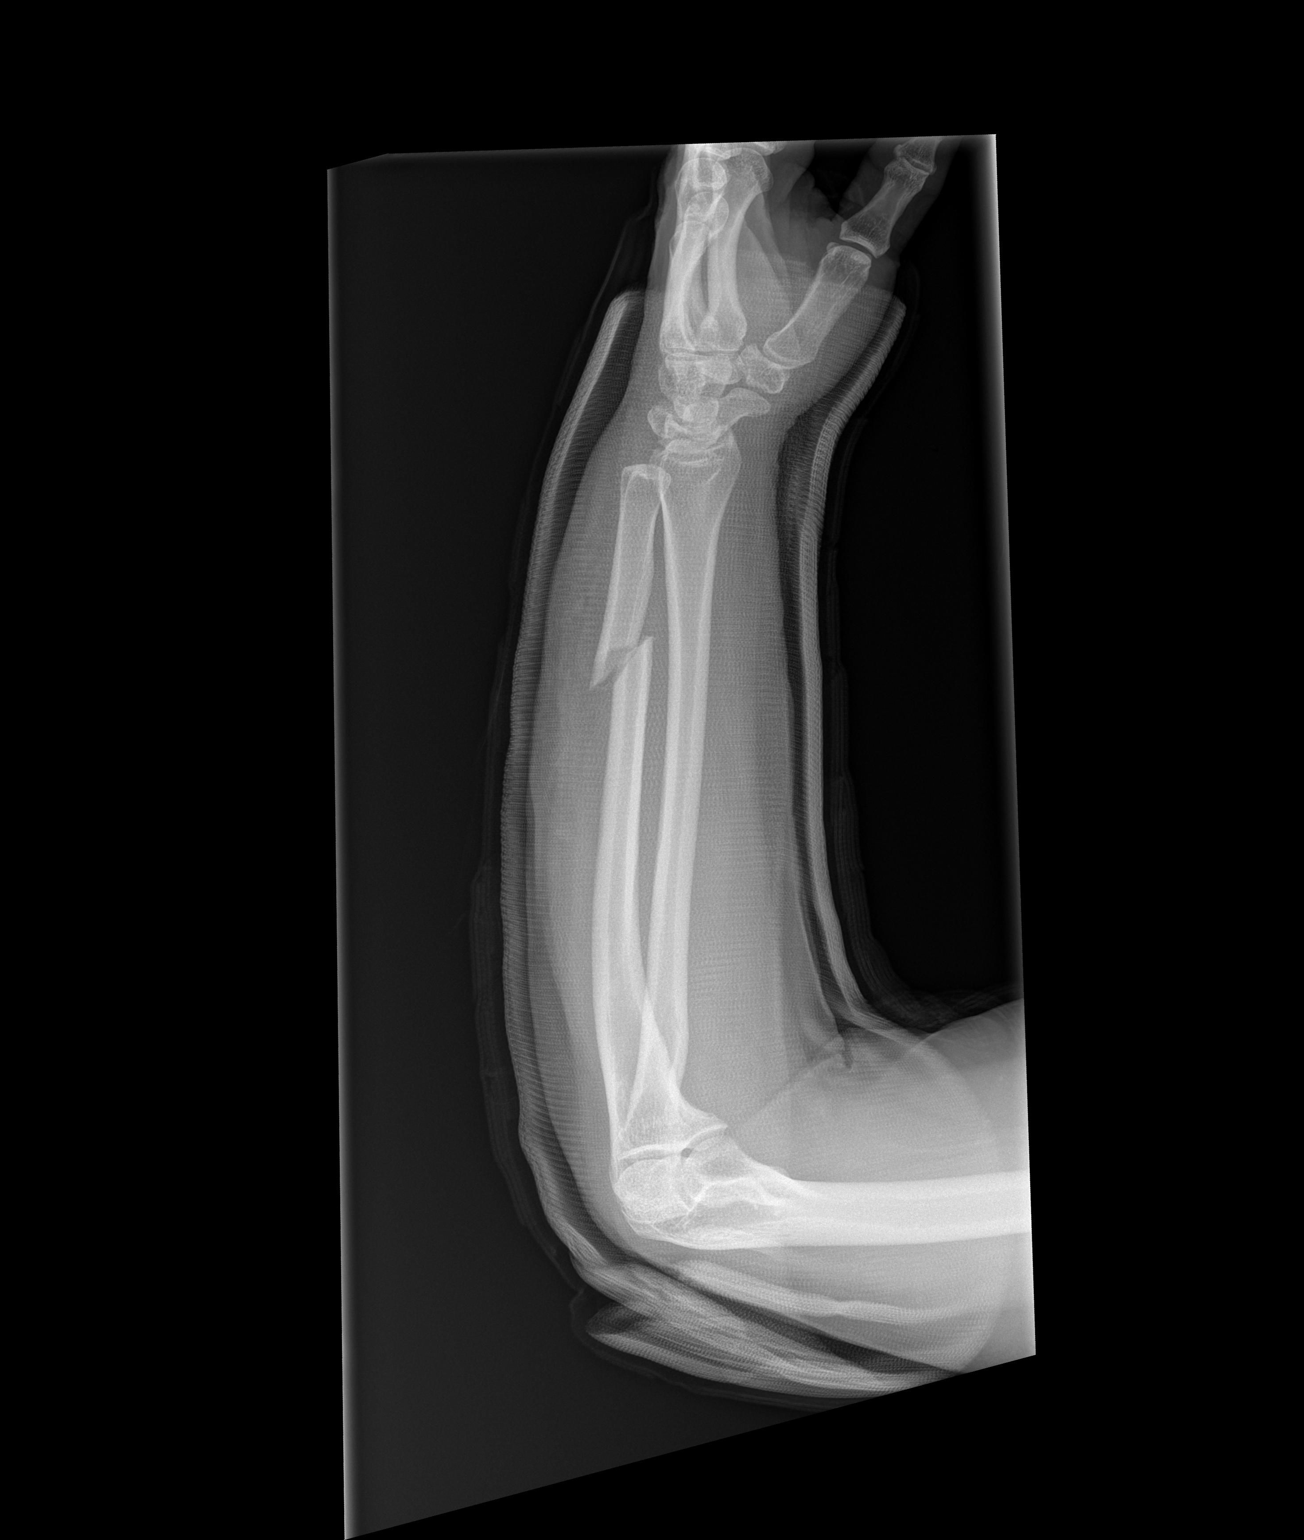

[2 of 2 positions shown; findings below may reference images not displayed]

FINDINGS: Oblique fracture of the distal ulnar diaphysis with 4 mm of medial
displacement and 9 mm of posterior displacement. There is no other
fracture or dislocation. Soft tissue swelling around the distal
forearm.
IMPRESSION: Oblique fracture of the distal left ulnar diaphysis with mild
displacement.
